# Patient Record
Sex: Male | Born: 1975
Health system: Southern US, Community
[De-identification: ages and names within clinical notes are randomized; demographics above are authoritative.]

## PROBLEM LIST (undated history)

## (undated) HISTORY — PX: UVULOPALATOPLASTY: SHX2633

## (undated) HISTORY — PX: TONSILLECTOMY: SUR1361

## (undated) HISTORY — PX: APPENDECTOMY: SHX54

---

## 2008-06-02 ENCOUNTER — Ambulatory Visit: Payer: Self-pay | Admitting: Occupational Medicine

## 2009-09-16 ENCOUNTER — Ambulatory Visit: Payer: Self-pay | Admitting: Family Medicine

## 2009-09-30 ENCOUNTER — Ambulatory Visit: Payer: Self-pay | Admitting: Family Medicine

## 2009-09-30 DIAGNOSIS — G2581 Restless legs syndrome: Secondary | ICD-10-CM

## 2009-09-30 DIAGNOSIS — G4700 Insomnia, unspecified: Secondary | ICD-10-CM | POA: Insufficient documentation

## 2009-10-01 LAB — CONVERTED CEMR LAB
ALT: 42 units/L (ref 0–53)
AST: 23 units/L (ref 0–37)
Alkaline Phosphatase: 81 units/L (ref 39–117)
BUN: 12 mg/dL (ref 6–23)
Calcium: 9.3 mg/dL (ref 8.4–10.5)
Chloride: 105 meq/L (ref 96–112)
Cholesterol: 172 mg/dL (ref 0–200)
HCT: 47.7 % (ref 39.0–52.0)
LDL Cholesterol: 113 mg/dL — ABNORMAL HIGH (ref 0–99)
MCHC: 34 g/dL (ref 30.0–36.0)
MCV: 91.9 fL (ref 78.0–100.0)
Platelets: 245 10*3/uL (ref 150–400)
RBC: 5.19 M/uL (ref 4.22–5.81)
RDW: 12.7 % (ref 11.5–15.5)
TSH: 2.193 microintl units/mL (ref 0.350–4.500)
Total CHOL/HDL Ratio: 3.8
Triglycerides: 70 mg/dL (ref <150)
VLDL: 14 mg/dL (ref 0–40)
WBC: 6.4 10*3/uL (ref 4.0–10.5)

## 2009-10-07 ENCOUNTER — Ambulatory Visit: Payer: Self-pay | Admitting: Family Medicine

## 2009-12-23 ENCOUNTER — Ambulatory Visit: Payer: Self-pay | Admitting: Family Medicine

## 2009-12-23 DIAGNOSIS — M25519 Pain in unspecified shoulder: Secondary | ICD-10-CM

## 2010-01-11 ENCOUNTER — Ambulatory Visit: Payer: Self-pay | Admitting: Family Medicine

## 2010-01-12 LAB — CONVERTED CEMR LAB
Iron: 94 ug/dL (ref 42–165)
UIBC: 191 ug/dL

## 2010-01-21 ENCOUNTER — Encounter: Payer: Self-pay | Admitting: Family Medicine

## 2010-01-21 ENCOUNTER — Encounter: Admission: RE | Admit: 2010-01-21 | Discharge: 2010-01-21 | Payer: Self-pay | Admitting: Sports Medicine

## 2010-01-25 ENCOUNTER — Encounter
Admission: RE | Admit: 2010-01-25 | Discharge: 2010-02-18 | Payer: Self-pay | Source: Home / Self Care | Attending: Sports Medicine | Admitting: Sports Medicine

## 2010-03-23 NOTE — Assessment & Plan Note (Signed)
Summary: Shoulder pain, RLS, insomnia   Vital Signs:  Patient profile:   35 year old male Height:      73 inches Weight:      223 pounds Pulse rate:   73 / minute BP sitting:   120 / 73  (right arm) Cuff size:   regular  Vitals Entered By: Avon Gully CMA, Duncan Dull) (December 23, 2009 11:25 AM) CC: left shoulder pain x 3-4 weeks, hurts to sleep on, feels like it lost strength, also med not working anymore   Primary Care Provider:  Nani Gasser MD  CC:  left shoulder pain x 3-4 weeks, hurts to sleep on, feels like it lost strength, and also med not working anymore.  History of Present Illness: left shoulder pain x 3-4 weeks, hurts to sleep on, feels like it lost strength. Felt weak initially to raise above his sholder level. Pain is over the deltoid and collar bone. Painful at rest if sits for long period like at church. painful to reach behind his back.  Using an OTC rub.  No ice or heat. Tried IBU a couple of time for more severe pain - helps some.  No injury or known triggers. Had a hx of bursitis in his shoulder but not sure which shoulder. It was more than 15 years ago.   Feels the  meds for his RLS is  not working as well. Tried taking 2 tabs because one pill wasn't helping.  2 tabs of requip did help. He also hasn't been sleeping as well so has taken 1.5 tabs of trazodone a few nights. that has helped.   Current Medications (verified): 1)  Requip 0.5 Mg Tabs (Ropinirole Hcl) .... Take 1-2  Tablet By Mouth Once A Day in The Evening 2)  Trazodone Hcl 100 Mg Tabs (Trazodone Hcl) .... Take 1 Tablet By Mouth Once A Day At Bedtime For Sleep 3)  Penlac 8 % Soln (Ciclopirox) .... Nail Lacquer.  Applyto Nail At Bedtime. After 7 Days Clean Off With Alcohol. Continue Until Fungus Resolves.  Allergies (verified): No Known Drug Allergies  Comments:  Nurse/Medical Assistant: The patient's medications and allergies were reviewed with the patient and were updated in the Medication  and Allergy Lists. Avon Gully CMA, Duncan Dull) (December 23, 2009 11:27 AM)  Physical Exam  General:  Well-developed,well-nourished,in no acute distress; alert,appropriate and cooperative throughout examination Msk:  Left shoulder with no swelling, NROM. Pain with reaching behind his back. Nontender. STrength 5/5 at the shoulder, elbow, wrist and thumb. Neg empty can test. Neck with NROM.     Impression & Recommendations:  Problem # 1:  SHOULDER PAIN (ICD-719.41) Assessment New Likely subacromial bursitis.  Rec stretches. H.O. given. Ice the area and NSAIDS. If not better in 2-3 weeks then followup.  It is already some better on its own. No lifting greater than 10 lbs and avoid activities that are bothersome.   Problem # 2:  RESTLESS LEGS SYNDROME (ICD-333.94) Assessment: Deteriorated  Increase to 1mg  dose.  Can half if needed.  Rule out deficienes that may be making his sxs worse.   Orders: T-Iron [id number]) T-Iron Binding Capacity (TIBC) ([address]) T-Vitamin B12 [id number]) T-Folate (66063)  Problem # 3:  INSOMNIA (ICD-780.52) Assessment: Deteriorated Continue current regime. If we can get her shoulder better then I really feel he will rest better.    Complete Medication List: 1)  Requip 1 Mg Tabs (Ropinirole hcl) .... Take 1 tablet by mouth once a day at bedtime 2)  Trazodone Hcl 100 Mg Tabs (Trazodone hcl) .... Take 1 tablet by mouth once a day at bedtime for sleep 3)  Penlac 8 % Soln (Ciclopirox) .... Nail lacquer.  applyto nail at bedtime. after 7 days clean off with alcohol. continue until fungus resolves.  Patient Instructions: 1)  Ibuprofen 600-800mg  three times a day with food and water for 5-6 days.   2)  Start the stretches on Saturday 3)  Ice the area three times a day if able.   Prescriptions: TRAZODONE HCL 100 MG TABS (TRAZODONE HCL) Take 1 tablet by mouth once a day at bedtime for sleep  #90 x 1   Entered and Authorized by:   Nani Gasser MD    Signed by:   Nani Gasser MD on 12/23/2009   Method used:   Electronically to        [facility] [phone]* (retail)       [address]       [place], [place]  [address]       Ph: [phone]       Fax: [phone]   RxID:   [id number] REQUIP 1 MG TABS (ROPINIROLE HCL) Take 1 tablet by mouth once a day at bedtime  #30 x 1   Entered and Authorized by:   Nani Gasser MD   Signed by:   Nani Gasser MD on 12/23/2009   Method used:   Electronically to        [facility] [phone]* (retail)       [address]       [place], [place]  [address]       Ph: [phone]       Fax: [phone]   RxID:   [id number]    Orders Added: 1)  Est. Patient Level IV [26948] 2)  Augusto Gamble [54627-03500] 3)  T-Iron Binding Capacity (TIBC) [[address]] 4)  T-Vitamin B12 [82607-23330] 5)  T-Folate [71696]

## 2010-03-23 NOTE — Letter (Signed)
Summary: [facility]  [facility]   Imported By: Maryln Gottron 01/29/2010 14:01:15  _____________________________________________________________________  External Attachment:    Type:   Image     Comment:   External Document

## 2010-03-23 NOTE — Assessment & Plan Note (Signed)
Summary: cough/chest congestion   Vital Signs:  Patient Profile:   35 Years Old Male CC:      cough and congestion X 5 days Height:     75 inches Weight:      220 pounds O2 Sat:      97 % O2 treatment:    Room Air Temp:     97.0 degrees F oral Pulse rate:   78 / minute Resp:     12 per minute BP sitting:   122 / 80  (right arm) Cuff size:   large  Vitals Entered By: Lajean Saver RN (September 16, 2009 12:42 PM)                  Updated Prior Medication List: DAYQUIL MULTI-SYMPTOM 30-325-10 MG/15ML LIQD (PSEUDOEPHEDRINE-APAP-DM) As directed  Current Allergies (reviewed today): No known allergies History of Present Illness Chief Complaint: cough and congestion X 5 days History of Present Illness:  Subjective: Patient complains of URI symptoms that started one week ago with a sore throat, now resolved.  He complains of persistent cough and nasal congestion.   No pleuritic pain No wheezing + post-nasal drainage No sinus pain/pressure No itchy/red eyes No earache No hemoptysis No SOB No fever/chills No nausea No vomiting No abdominal pain No diarrhea No skin rashes + fatigue No myalgias No headache Used OTC meds without relief   REVIEW OF SYSTEMS Constitutional Symptoms      Denies fever, chills, night sweats, weight loss, weight gain, and fatigue.  Eyes       Denies change in vision, eye pain, eye discharge, glasses, contact lenses, and eye surgery. Ear/Nose/Throat/Mouth       Complains of sinus problems and hoarseness.      Denies hearing loss/aids, change in hearing, ear pain, ear discharge, dizziness, frequent runny nose, frequent nose bleeds, sore throat, and tooth pain or bleeding.  Respiratory       Complains of productive cough and shortness of breath.      Denies dry cough, wheezing, asthma, bronchitis, and emphysema/COPD.  Cardiovascular       Denies murmurs, chest pain, and tires easily with exhertion.    Gastrointestinal       Denies stomach  pain, nausea/vomiting, diarrhea, constipation, blood in bowel movements, and indigestion. Genitourniary       Denies painful urination, kidney stones, and loss of urinary control. Neurological       Denies paralysis, seizures, and fainting/blackouts. Musculoskeletal       Denies muscle pain, joint pain, joint stiffness, decreased range of motion, redness, swelling, muscle weakness, and gout.  Skin       Denies bruising, unusual mles/lumps or sores, and hair/skin or nail changes.  Psych       Denies mood changes, temper/anger issues, anxiety/stress, speech problems, depression, and sleep problems.  Past History:  Past Medical History: Reviewed history from 06/02/2008 and no changes required. Unremarkable  Past Surgical History: Reviewed history from 06/02/2008 and no changes required. Appendectomy Sleep Apnea  Social History: Reviewed history from 06/02/2008 and no changes required. Married Never Smoked Alcohol use-no Drug use-no Regular exercise-no   Objective:  Appearance:  Patient appears healthy, stated age, and in no acute distress  Eyes:  Pupils are equal, round, and reactive to light and accomdation.  Extraocular movement is intact.  Conjunctivae are not inflamed.  Ears:  Canals normal.  Tympanic membranes normal.   Nose:  Normal septum.  Normal turbinates, mildly congested.    No sinus  tenderness present.  Pharynx:  Normal  Neck:  Supple.  No adenopathy is present.  No thyromegaly is present  Lungs:  Clear to auscultation.  Breath sounds are equal.  Heart:  Regular rate and rhythm without murmurs, rubs, or gallops.  Abdomen:  Nontender without masses or hepatosplenomegaly.  Bowel sounds are present.  No CVA or flank tenderness.  Skin:  No rash Assessment New Problems: URI (ICD-465.9)  VIRAL URI  Plan New Medications/Changes: AZITHROMYCIN 250 MG TABS (AZITHROMYCIN) Two tabs by mouth on day 1, then 1 tab daily on days 2 through 5  (Rx void after 09/23/09)  #6  tabs x 0, 09/16/2009, Donna Christen MD BENZONATATE 200 MG CAPS (BENZONATATE) One by mouth hs as needed cough  #12 x 0, 09/16/2009, Donna Christen MD  New Orders: Est. Patient Level III [phone] Planning Comments:   Treat symptomatically for now:  expectorant/decongestant, fluids, cough suppressant at bedtime. Add Z-pack (given Rx to hold) if not improving within one week or if persistent fever develops.   The patient and/or caregiver has been counseled thoroughly with regard to medications prescribed including dosage, schedule, interactions, rationale for use, and possible side effects and they verbalize understanding.  Diagnoses and expected course of recovery discussed and will return if not improved as expected or if the condition worsens. Patient and/or caregiver verbalized understanding.  Prescriptions: AZITHROMYCIN 250 MG TABS (AZITHROMYCIN) Two tabs by mouth on day 1, then 1 tab daily on days 2 through 5  (Rx void after 09/23/09)  #6 tabs x 0   Entered and Authorized by:   Donna Christen MD   Signed by:   Donna Christen MD on 09/16/2009   Method used:   Print then Give to Patient   RxID:   [id number] BENZONATATE 200 MG CAPS (BENZONATATE) One by mouth hs as needed cough  #12 x 0   Entered and Authorized by:   Donna Christen MD   Signed by:   Donna Christen MD on 09/16/2009   Method used:   Print then Give to Patient   RxID:   [id number]   Patient Instructions: 1)  May use Mucinex D (guaifenesin with decongestant) twice daily for congestion. 2)  Increase fluid intake, rest. 3)  May use Afrin nasal spray (or generic oxymetazoline) twice daily for about 5 days.  Also recommend using saline nasal spray several times daily and/or saline nasal irrigation. 4)  Add azithromycin if persistent fever develops, or if not improving in 5 to 7 days. 5)  Followup with family doctor if not improving 7 to 10 days.  Orders Added: 1)  Est. Patient Level III [29528]

## 2010-03-23 NOTE — Assessment & Plan Note (Signed)
Summary: NOV: RLS, Insomnia   Vital Signs:  Patient profile:   35 year old male Height:      73 inches Weight:      219 pounds BMI:     29.00 Pulse rate:   73 / minute BP sitting:   116 / 76  (left arm) Cuff size:   regular  Vitals Entered By: Avon Gully CMA, Duncan Dull) (September 30, 2009 8:56 AM) CC: NP--difficulty sleeping at night.hx of restless leg syndrome, did take Requip in the past,not currently taking anything   CC:  NP--difficulty sleeping at night.hx of restless leg syndrome, did take Requip in the past, and not currently taking anything.  History of Present Illness: NP--difficulty sleeping at night.  hx of restless leg syndrome, did take Requip 0.5mg  in the past,not currently taking anything. Says it really worked well but wasn't sure if he was supposed to continue it or not so completed hid prescription and didn't refill it.  His sxs have been worse lately.  IN addition, hasn't been sleeping well. Waking up 5-6 times at night. Does't feel very stressed. Having a hard time falling asleep.  Says this is really chronic.   When really exhausted does sleep better.  WAs on Ambien at one time and it did really help. Has also tried Tylenol PM but feels groggy the first 2-3 hours.   Habits & Providers  Alcohol-Tobacco-Diet     Alcohol drinks/day: <1     Tobacco Status: never  Exercise-Depression-Behavior     Does Patient Exercise: no     STD Risk: never     Drug Use: never     Seat Belt Use: always  Current Medications (verified): 1)  None  Allergies (verified): No Known Drug Allergies  Comments:  Nurse/Medical Assistant: The patient's medications and allergies were reviewed with the patient and were updated in the Medication and Allergy Lists. Avon Gully CMA, Duncan Dull) (September 30, 2009 8:59 AM)  Past History:  Past Surgical History: Appendectomy Sleep Apnea - [facility]  Family History: None  Social History: Works at the [employer] at [employer] Assoc  degree. Married to Amy with one child.  Never Smoked Alcohol use-no Drug use-no Regular exercise-no 4 caffeinated drinks per day. STD Risk:  never Drug Use:  never Seat Belt Use:  always  Review of Systems       No fever/sweats/weakness, unexplained weight loss/gain.  No vison changes.  No difficulty hearing/ringing in ears, hay fever/allergies.  No chest pain/discomfort, palpitations.  No Br lump/nipple discharge.  No cough/wheeze.  No blood in BM, nausea/vomiting/diarrhea.  No nighttime urination, leaking urine, unusual vaginal bleeding, discharge (penis or vagina).  No muscle/joint pain. No rash, change in mole.  No HA, memory loss.  No anxiety, + sleep d/o, no depression.  No easy bruising/bleeding, unexplained lump   Physical Exam  General:  Well-developed,well-nourished,in no acute distress; alert,appropriate and cooperative throughout examination Head:  Normocephalic and atraumatic without obvious abnormalities. No apparent alopecia or balding. Lungs:  Normal respiratory effort, chest expands symmetrically. Lungs are clear to auscultation, no crackles or wheezes. Heart:  Normal rate and regular rhythm. S1 and S2 normal without gallop, murmur, click, rub or other extra sounds. Skin:  no rashes.   Psych:  Cognition and judgment appear intact. Alert and cooperative with normal attention span and concentration. No apparent delusions, illusions, hallucinations   Impression & Recommendations:  Problem # 1:  RESTLESS LEGS SYNDROME (ICD-333.94) Will restart requip. Restart with low dose. Will rule out anemia  and thyroid d/o which can mimick RLS symptoms.  Orders: T-CBC No Diff ([id number]) T-TSH ([id number])  Problem # 2:  INSOMNIA (ICD-780.52) Discussed sleep hygiene. In addition discussed sleep aids. His insomnia is really chronic so wouldlike to start with trazodone before staring with [place] or lunesta which can cause dependence.  Discussed potential SE of these medications.  F/U in 1 month to see how well it is workon.   Complete Medication List: 1)  Requip 0.5 Mg Tabs (Ropinirole hcl) .... Take 1 tablet by mouth once a day 2)  Trazodone Hcl 50 Mg Tabs (Trazodone hcl) .... 1/2 to 1 tab about 1 hour before bedtime for insomnia  Other Orders: T-Comprehensive Metabolic Panel [id number]) T-Lipid Profile ([id number])  Patient Instructions: 1)  Can schedule your physical anytime. 2)  Also recommend follow up for insomnia in 1-2 months.  Prescriptions: TRAZODONE HCL 50 MG TABS (TRAZODONE HCL) 1/2 to 1 tab about 1 hour before bedtime for insomnia  #30 x 1   Entered and Authorized by:   Nani Gasser MD   Signed by:   Nani Gasser MD on 09/30/2009   Method used:   Electronically to        [facility] [phone]* (retail)       [address]       [place], [place]  [address]       Ph: [phone]       Fax: [phone]   RxID:   [id number] REQUIP 0.5 MG TABS (ROPINIROLE HCL) Take 1 tablet by mouth once a day  #30 x 2   Entered and Authorized by:   Nani Gasser MD   Signed by:   Nani Gasser MD on 09/30/2009   Method used:   Electronically to        [facility] [phone]* (retail)       [address]       [place], [place]  [address]       Ph: [phone]       Fax: [phone]   RxID:   [id number]

## 2010-03-23 NOTE — Assessment & Plan Note (Signed)
Summary: CPE   Vital Signs:  Patient profile:   35 year old male Height:      73 inches Weight:      222 pounds Pulse rate:   79 / minute BP sitting:   132 / 81  (left arm) Cuff size:   regular  Vitals Entered By: Avon Gully CMA, Duncan Dull) (October 07, 2009 11:07 AM) CC: CPE   Primary Care Ruben Mahler:  Nani Gasser MD  CC:  CPE.  History of Present Illness: Here for CPE.  No specific compaints. F/u of RLS and insomnia.    When was taking the requip and trazodone together was causing a HA. When separated them out the HA went away.  Meds were helping his RLS and sleep until last night. Last night didn't sleep at all until almost 3AM. No mind racing and anxiety.  Felt legs twitching and this  kept him awake. Trazodone has been helping some. Still waking up at night but not as much. Has worked on cutting back his caffeine after 2 o'clock.  Says Monday night when couldn't sleep did drink caffeine a bedtime.   Current Medications (verified): 1)  Requip 0.5 Mg Tabs (Ropinirole Hcl) .... Take 1 Tablet By Mouth Once A Day 2)  Trazodone Hcl 50 Mg Tabs (Trazodone Hcl) .... 1/2 To 1 Tab About 1 Hour Before Bedtime For Insomnia  Allergies (verified): No Known Drug Allergies  Comments:  Nurse/Medical Assistant: The patient's medications and allergies were reviewed with the patient and were updated in the Medication and Allergy Lists. Avon Gully CMA, Duncan Dull) (October 07, 2009 11:08 AM)  Past History:  Past Medical History: Last updated: 06/02/2008 Unremarkable  Past Surgical History: Last updated: 09/30/2009 Appendectomy Sleep Apnea - [facility]  Family History: Last updated: 09/30/2009 None  Social History: Last updated: 09/30/2009 Works at the [employer] at [employer]. Assoc degree. Married to Amy with one child.  Never Smoked Alcohol use-no Drug use-no Regular exercise-no 4 caffeinated drinks per day.   Review of Systems  The patient denies anorexia, fever,  weight loss, weight gain, vision loss, decreased hearing, hoarseness, chest pain, syncope, dyspnea on exertion, peripheral edema, prolonged cough, headaches, hemoptysis, abdominal pain, melena, hematochezia, severe indigestion/heartburn, hematuria, incontinence, genital sores, muscle weakness, suspicious skin lesions, transient blindness, difficulty walking, depression, unusual weight change, abnormal bleeding, enlarged lymph nodes, and testicular masses.    Physical Exam  General:  Well-developed,well-nourished,in no acute distress; alert,appropriate and cooperative throughout examination Head:  Normocephalic and atraumatic without obvious abnormalities. No apparent alopecia or balding. Eyes:  No corneal or conjunctival inflammation noted. EOMI. Perrla.  Ears:  External ear exam shows no significant lesions or deformities.  Otoscopic examination reveals clear canals, tympanic membranes are intact bilaterally without bulging, retraction, inflammation or discharge. Hearing is grossly normal bilaterally. Nose:  External nasal examination shows no deformity or inflammation.  Mouth:  Oral mucosa and oropharynx without lesions or exudates.  Teeth in good repair. Neck:  No deformities, masses, or tenderness noted. Chest Wall:  No deformities, masses, tenderness or gynecomastia noted. Lungs:  Normal respiratory effort, chest expands symmetrically. Lungs are clear to auscultation, no crackles or wheezes. Heart:  Normal rate and regular rhythm. S1 and S2 normal without gallop, murmur, click, rub or other extra sounds. Abdomen:  Bowel sounds positive,abdomen soft and non-tender without masses, organomegaly or hernias noted. Msk:  No deformity or scoliosis noted of thoracic or lumbar spine.   Pulses:  R and L carotid,radial,dorsalis pedis and posterior tibial pulses are full  and equal bilaterally Extremities:  No clubbing, cyanosis, edema, or deformity noted with normal full range of motion of all joints.     Neurologic:  No cranial nerve deficits noted. Station and gait are normal.  DTRs are symmetrical throughout. Sensory, motor and coordinative functions appear intact. Skin:  no rashes.   Cervical Nodes:  No lymphadenopathy noted Psych:  Cognition and judgment appear intact. Alert and cooperative with normal attention span and concentration. No apparent delusions, illusions, hallucinations   Impression & Recommendations:  Problem # 1:  HEALTH MAINTENANCE EXAM (ICD-V70.0) Here for CPE Reviewed his labs with him.Recheck cholesterol inj one year.  He reports his Td is up to date Encourage regular exercise and healthy die  Problem # 2:  INSOMNIA (ICD-780.52) Increase trazodone 100mg  nightly. F/U in 3 montsh or sooner if not working well.   Problem # 3:  RESTLESS LEGS SYNDROME (ICD-333.94) Dsicussed options of adding extra tab on night where his RLS is worse vs increasing hisnightly dose. He opted for first options. He will let me know if this works well and if so then will send  90 day rx for mail order.  f/u in 3 months.   Complete Medication List: 1)  Requip 0.5 Mg Tabs (Ropinirole hcl) .... Take 1-2  tablet by mouth once a day in the evening 2)  Trazodone Hcl 100 Mg Tabs (Trazodone hcl) .... Take 1 tablet by mouth once a day at bedtime for sleep 3)  Penlac 8 % Soln (Ciclopirox) .... Nail lacquer.  applyto nail at bedtime. after 7 days clean off with alcohol. continue until fungus resolves.  Patient Instructions: 1)  It is important that you exercise reguarly at least 20 minutes 5 times a week. If you develop chest pain, have severe difficulty breathing, or feel very tired, stop exercising immediately and seek medical attention.  2)  Can try the Penlac for your toenails.   Prescriptions: PENLAC 8 % SOLN (CICLOPIROX) Nail lacquer.  Applyto nail at bedtime. After 7 days clean off with alcohol. Continue until fungus resolves.  #1 bottle. x 2   Entered and Authorized by:   Nani Gasser  MD   Signed by:   Nani Gasser MD on 10/07/2009   Method used:   Electronically to        [facility] [phone]* (retail)       [address] [place], [place]  [address]       Ph: [phone]       Fax: [phone]   RxID:   [id number] TRAZODONE HCL 100 MG TABS (TRAZODONE HCL) Take 1 tablet by mouth once a day at bedtime for sleep  #30 x 1   Entered and Authorized by:   Nani Gasser MD   Signed by:   Nani Gasser MD on 10/07/2009   Method used:   Electronically to        [facility] [phone]* (retail)       [address]       [place], [place]  [address]       Ph: [phone]       Fax: [phone]   RxID:   [id number]   TD Result Date:  02/22/2008    Immunization History:  Tetanus/Td Immunization History:    Tetanus/Td:  historical (02/22/2008)

## 2010-03-23 NOTE — Assessment & Plan Note (Signed)
Summary: [name] should pain   Vital Signs:  Patient profile:   35 year old male Height:      73 inches Weight:      225 pounds Pulse rate:   85 / minute BP sitting:   119 / 78  (right arm) Cuff size:   regular  Vitals Entered By: Avon Gully CMA, Duncan Dull) (January 11, 2010 11:42 AM) CC: f/u left shoulder pain, no better   Primary Care Provider:  Nani Gasser MD  CC:  f/u left shoulder pain and no better.  History of Present Illness: f/u left shoulder pain, no better. Has stil been lifting things at his job. Still very painful to reach behind his shoulder.  Now has a burning sensation over the top of the shoulder towards teh deltoid. Worse and irritated with driving. IBU made his pain was.  Still taking it. ONly taking trazodone and requip at bedtime and causing nasal congestion.  Resting his arms on his chair at work or wrapped around his pillow helps.   Current Medications (verified): 1)  Requip 1 Mg Tabs (Ropinirole Hcl) .... Take 1 Tablet By Mouth Once A Day At Bedtime 2)  Trazodone Hcl 100 Mg Tabs (Trazodone Hcl) .... Take 1 Tablet By Mouth Once A Day At Bedtime For Sleep 3)  Penlac 8 % Soln (Ciclopirox) .... Nail Lacquer.  Applyto Nail At Bedtime. After 7 Days Clean Off With Alcohol. Continue Until Fungus Resolves. 4)  Ibuprofen 800 Mg Tabs (Ibuprofen)  Allergies (verified): No Known Drug Allergies  Comments:  Nurse/Medical Assistant: The patient's medications and allergies were reviewed with the patient and were updated in the Medication and Allergy Lists. Avon Gully CMA, Duncan Dull) (January 11, 2010 11:43 AM)  Physical Exam  General:  Well-developed,well-nourished,in no acute distress; alert,appropriate and cooperative throughout examination Msk:  Left shoulder wtih NROM. Some pain with reaching behind his back.  STerngth 5/5. Mild tender over the upper deltoid.    Impression & Recommendations:  Problem # 1:  SHOULDER PAIN (ICD-719.41) I still think he  likley has bursitis. Discussed option. Will refer to sports med for further evaluation. Try to rest his shoulder over the Holidays.  His updated medication list for this problem includes:    Ibuprofen 800 Mg Tabs (Ibuprofen)  Orders: Sports Medicine (Sports Med)  Complete Medication List: 1)  Requip 1 Mg Tabs (Ropinirole hcl) .... Take 1 tablet by mouth once a day at bedtime 2)  Trazodone Hcl 100 Mg Tabs (Trazodone hcl) .... Take 1 tablet by mouth once a day at bedtime for sleep 3)  Penlac 8 % Soln (Ciclopirox) .... Nail lacquer.  applyto nail at bedtime. after 7 days clean off with alcohol. continue until fungus resolves. 4)  Ibuprofen 800 Mg Tabs (Ibuprofen)  Patient Instructions: 1)  We will call you with the referral information.    Orders Added: 1)  Sports Medicine [Sports Med] 2)  Est. Patient Level III [57846]

## 2010-09-30 ENCOUNTER — Inpatient Hospital Stay (INDEPENDENT_AMBULATORY_CARE_PROVIDER_SITE_OTHER)
Admission: RE | Admit: 2010-09-30 | Discharge: 2010-09-30 | Disposition: A | Discharge status: Home / Self Care | Payer: BC Managed Care – PPO | Source: Ambulatory Visit | Attending: Family Medicine | Admitting: Family Medicine

## 2010-09-30 ENCOUNTER — Encounter: Payer: Self-pay | Admitting: Family Medicine

## 2010-09-30 ENCOUNTER — Telehealth: Payer: Self-pay | Admitting: *Deleted

## 2010-09-30 DIAGNOSIS — H05019 Cellulitis of unspecified orbit: Secondary | ICD-10-CM | POA: Insufficient documentation

## 2010-09-30 NOTE — Telephone Encounter (Signed)
Pt calling with what sounds like conjunctivitis, started with ST on Tuesday, then half way through yest started with itchy eyes, and today crusty eyes and discharge when he woke up.  Didn't feel well yesterday.  Do you want to see this problem in the office?? Routed to Dr. Linford Arnold SHT/LPN

## 2010-09-30 NOTE — Telephone Encounter (Signed)
He'll need to be seen. Dr. Cathey Endow has an acute visit for her scheduled for tomorrow.

## 2010-09-30 NOTE — Telephone Encounter (Signed)
Pt called back to find out the status of an appt or what to do for the pinkeye. Plan:  Pt instructed will need an office visit and since Dr. Cathey Endow out of the office this afternoon and pt does not want to wait til tomorrow he was instructed to go to [facility] this afternoon. Jarvis Newcomer, LPN Domingo Dimes

## 2010-10-02 ENCOUNTER — Telehealth (INDEPENDENT_AMBULATORY_CARE_PROVIDER_SITE_OTHER): Payer: Self-pay

## 2011-01-24 NOTE — Telephone Encounter (Signed)
  Phone Note Outgoing Call   Call placed by: Linton Flemings RN,  October 02, 2010 3:31 PM Call placed to: Patient Summary of Call: pt states he is doing better

## 2011-01-24 NOTE — Progress Notes (Signed)
Summary: Possible Pink eye (L) (room 5)   Vital Signs:  Patient Profile:   35 Years Old Male CC:      left eye discomfort/conjunctivitis/drainage Height:     75 inches Weight:      222 pounds O2 Sat:      96 % O2 treatment:    Room Air Temp:     98.3 degrees F oral Pulse rate:   85 / minute Resp:     16 per minute BP sitting:   125 / 89  (left arm) Cuff size:   large  Pt. in pain?   yes    Location:   left eye  Vitals Entered By: Lavell Islam RN (September 30, 2010 4:28 PM)                   Prior Medication List:  REQUIP 1 MG TABS (ROPINIROLE HCL) Take 1 tablet by mouth once a day at bedtime TRAZODONE HCL 100 MG TABS (TRAZODONE HCL) Take 1 tablet by mouth once a day at bedtime for sleep PENLAC 8 % SOLN (CICLOPIROX) Nail lacquer.  Applyto nail at bedtime. After 7 days clean off with alcohol. Continue until fungus resolves. IBUPROFEN 800 MG TABS (IBUPROFEN)    Updated Prior Medication List: IBUPROFEN 800 MG TABS (IBUPROFEN) as needed  Current Allergies: No known allergies History of Present Illness Chief Complaint: left eye discomfort/conjunctivitis/drainage History of Present Illness: Patient w/pain inthe l eye. He reports having a sore throat Tuesday night, hedache eye discomfort in the L eye on Wednesday and increase tearing, he reports as he put it eye boggers today, pain, and general malaise.   Current Problems: ORBITAL CELLULITIS (ICD-376.01) SHOULDER PAIN (ICD-719.41) HEALTH MAINTENANCE EXAM (ICD-V70.0) RESTLESS LEGS SYNDROME (ICD-333.94) INSOMNIA (ICD-780.52)   Current Meds IBUPROFEN 800 MG TABS (IBUPROFEN) as needed AUGMENTIN 875-125 MG TABS (AMOXICILLIN-POT CLAVULANATE) 1 by mouth 2 times daily ALLEGRA-D ALLERGY & CONGESTION 180-240 MG XR24H-TAB (FEXOFENADINE-PSEUDOEPHEDRINE) 1 by mouth q day  REVIEW OF SYSTEMS Constitutional Symptoms       Complains of fatigue.     Denies fever, chills, night sweats, weight loss, and weight gain.  Eyes   Complains of eye pain and eye drainage.      Denies change in vision, glasses, contact lenses, and eye surgery. Ear/Nose/Throat/Mouth       Complains of sore throat.      Denies hearing loss/aids, change in hearing, ear pain, ear discharge, dizziness, frequent runny nose, frequent nose bleeds, sinus problems, hoarseness, and tooth pain or bleeding.  Respiratory       Denies dry cough, productive cough, wheezing, shortness of breath, asthma, bronchitis, and emphysema/COPD.  Cardiovascular       Denies murmurs, chest pain, and tires easily with exhertion.    Gastrointestinal       Denies stomach pain, nausea/vomiting, diarrhea, constipation, blood in bowel movements, and indigestion. Genitourniary       Denies painful urination, kidney stones, and loss of urinary control. Neurological       Complains of headaches.      Denies paralysis, seizures, and fainting/blackouts. Musculoskeletal       Denies muscle pain, joint pain, joint stiffness, decreased range of motion, redness, swelling, muscle weakness, and gout.  Skin       Denies bruising, unusual mles/lumps or sores, and hair/skin or nail changes.  Psych       Denies mood changes, temper/anger issues, anxiety/stress, speech problems, depression, and sleep problems. Other Comments: left eye drainage/  itching/ conjunctivitis   Past History:  Family History: Last updated: 09/30/2009 None  Social History: Last updated: 09/30/2009 Works at the [employer] at [employer]. Assoc degree. Married to Amy with one child.  Never Smoked Alcohol use-no Drug use-no Regular exercise-no 4 caffeinated drinks per day.   Risk Factors: Alcohol Use: <1 (09/30/2009) Exercise: no (09/30/2009)  Risk Factors: Smoking Status: never (09/30/2009)  Past Medical History: Reviewed history from 06/02/2008 and no changes required. Unremarkable  Past Surgical History: Reviewed history from 09/30/2009 and no changes required. Appendectomy Sleep  Apnea - UVP  Family History: Reviewed history from 09/30/2009 and no changes required. None  Social History: Reviewed history from 09/30/2009 and no changes required. Works at the [employer] at [employer]. Assoc degree. Married to Amy with one child.  Never Smoked Alcohol use-no Drug use-no Regular exercise-no 4 caffeinated drinks per day.  Physical Exam General appearance: well developed, well nourished, no acute distress Head: normocephalic, atraumatic Eyes: allergic shiners around both eyes wL>R, increase L periorbital swelling as well n Pupils: equal, round, reactive to light Ears: normal, no lesions or deformities Nasal: mucosa pink, nonedematous, no septal deviation, turbinates normal Neck: neck supple,  trachea midline, no masses Skin: no obvious rashes or lesions MSE: oriented to time, place, and person Assessment Additional workup planned New Problems: ORBITAL CELLULITIS (ICD-376.01)   Patient Education: Patient and/or caregiver instructed in the following: rest fluids and Tylenol.  Plan New Medications/Changes: ALLEGRA-D ALLERGY & CONGESTION 180-240 MG XR24H-TAB (FEXOFENADINE-PSEUDOEPHEDRINE) 1 by mouth q day  #30 x 0, 09/30/2010, Hassan Rowan MD AUGMENTIN [id number] MG TABS (AMOXICILLIN-POT CLAVULANATE) 1 by mouth 2 times daily  #20 x 0, 09/30/2010, Hassan Rowan MD  New Orders: Rocephin  250mg  [X5284] Est. Patient Level III [13244] Follow Up: Follow up in 2-3 days if no improvement, Follow up on an as needed basis, Follow up with Primary Physician Work/School Excuse: Return to work/school in 2 days  The patient and/or caregiver has been counseled thoroughly with regard to medications prescribed including dosage, schedule, interactions, rationale for use, and possible side effects and they verbalize understanding.  Diagnoses and expected course of recovery discussed and will return if not improved as expected or if the condition worsens. Patient and/or caregiver  verbalized understanding.  Prescriptions: ALLEGRA-D ALLERGY & CONGESTION 180-240 MG XR24H-TAB (FEXOFENADINE-PSEUDOEPHEDRINE) 1 by mouth q day  #30 x 0   Entered and Authorized by:   Hassan Rowan MD   Signed by:   Hassan Rowan MD on 09/30/2010   Method used:   Printed then faxed to ...       [facility] [phone]* (retail)       [address] [place], [place]  [address]       Ph: [phone]       Fax: [phone]   RxID:   [id number] AUGMENTIN 875-125 MG TABS (AMOXICILLIN-POT CLAVULANATE) 1 by mouth 2 times daily  #20 x 0   Entered and Authorized by:   Hassan Rowan MD   Signed by:   Hassan Rowan MD on 09/30/2010   Method used:   Printed then faxed to ...       [facility] [phone]* (retail)       [address] [place], [place]  [address]       Ph: [phone]       Fax: [phone]   RxID:   [id number]   Patient Instructions:  1)  Please schedule a follow-up appointment as needed. 2)  Please schedule an appointment with your primary doctor in :3-5 days if notimproving 3)  Take your antibiotic as prescribed until ALL of it is gone, but stop if you develop a rash or swelling and contact our office as soon as possible. 4)  Recommended remaining out of work for tomorrow.  Medication Administration  Injection # 1:    Medication: Rocephin  250mg     Route: IM    Site: RUOQ gluteus    Exp Date: 04/21/2013    Lot #: [id number]    Mfr: Sandoz    Comments: 1gm given    Patient tolerated injection without complications    Given by: Lajean Saver RN (September 30, 2010 5:02 PM)  Orders Added: 1)  Rocephin  250mg  [J0696] 2)  Est. Patient Level III [04540]

## 2011-01-24 NOTE — Letter (Signed)
Summary: Out of Work  [facility]  [address]   [place], [place] [address]   Phone: [phone]  Fax: [phone]    September 30, 2010   Employee:  KYON BENTLER    To Whom It May Concern:   For Medical reasons, please excuse the above named employee from work for the following dates:  Start:   09/30/2010  Return:   10/02/2010  If you need additional information, please feel free to contact our office.         Sincerely,    Hassan Rowan MD

## 2011-04-22 ENCOUNTER — Ambulatory Visit (INDEPENDENT_AMBULATORY_CARE_PROVIDER_SITE_OTHER): Payer: BC Managed Care – PPO | Admitting: Family Medicine

## 2011-04-22 DIAGNOSIS — J329 Chronic sinusitis, unspecified: Secondary | ICD-10-CM

## 2011-04-22 MED ORDER — AMOXICILLIN-POT CLAVULANATE 875-125 MG PO TABS: 1.0000 | ORAL_TABLET | Freq: Two times a day (BID) | ORAL | Status: AC

## 2011-04-22 NOTE — Progress Notes (Signed)
  Subjective:    Patient ID: Terry King, male    DOB: [birth year], 36 y.o.   MRN: [id number]  HPI Had a URI 2 weeks ago and felt a little better and then 8 days ago ST again.  +runny nose and nasal congestion.  Was getting brown colored sputum  Teeth started hurting again last night.  Left side of face below and above the eye is painful when sniffs. Had fever 5-6 days ago for about 1 days.  Last decongestant was 2 days ago.  Feels he is getting worse over the last 2 days. Had cough initiallly but that ius actually some better.    Review of Systems     Objective:   Physical Exam  Constitutional: He is oriented to person, place, and time. He appears well-developed and well-nourished.  HENT:  Head: Normocephalic and atraumatic.  Right Ear: External ear normal.  Left Ear: External ear normal.  Nose: Nose normal.  Mouth/Throat: Oropharynx is clear and moist.       TMs and canals are clear. Very tender over the right maxillary sinus and some swelling there as well.   Eyes: Conjunctivae and EOM are normal. Pupils are equal, round, and reactive to light.  Neck: Neck supple. No thyromegaly present.  Cardiovascular: Normal rate and normal heart sounds.   Pulmonary/Chest: Effort normal and breath sounds normal.  Lymphadenopathy:    He has no cervical adenopathy.  Neurological: He is alert and oriented to person, place, and time.  Skin: Skin is warm and dry.  Psychiatric: He has a normal mood and affect.          Assessment & Plan:  Sinusitis - Symptomatic care.  Will tx with ABX. I think he had a URI an dnow has a secondary bacterial sinus infection. Call if not better in one week. Can certainly add a mucinex or decongestant product if would like.

## 2011-04-22 NOTE — Patient Instructions (Signed)

## 2011-08-26 ENCOUNTER — Telehealth: Payer: Self-pay | Admitting: *Deleted

## 2011-08-26 MED ORDER — SCOPOLAMINE 1 MG/3DAYS TD PT72: 1.0000 | MEDICATED_PATCH | TRANSDERMAL | Status: DC

## 2011-08-26 NOTE — Telephone Encounter (Signed)
Pt states he is taking a trip to [place] and would like to know if we can send an rx to pharmacy for a med for motion sickness. Please advise.

## 2011-08-26 NOTE — Telephone Encounter (Signed)
Pt informed

## 2011-08-26 NOTE — Telephone Encounter (Signed)
Sent rx for motion sickness patch. Hope he has an awesome time!!!!!

## 2012-01-16 ENCOUNTER — Encounter: Payer: Self-pay | Admitting: Family Medicine

## 2012-01-16 ENCOUNTER — Ambulatory Visit (INDEPENDENT_AMBULATORY_CARE_PROVIDER_SITE_OTHER): Payer: BC Managed Care – PPO

## 2012-01-16 ENCOUNTER — Ambulatory Visit (INDEPENDENT_AMBULATORY_CARE_PROVIDER_SITE_OTHER): Payer: BC Managed Care – PPO | Admitting: Family Medicine

## 2012-01-16 ENCOUNTER — Other Ambulatory Visit: Payer: Self-pay | Admitting: Family Medicine

## 2012-01-16 VITALS — BP 139/82 | HR 92 | Ht 75.0 in | Wt 230.0 lb

## 2012-01-16 DIAGNOSIS — M25561 Pain in right knee: Secondary | ICD-10-CM

## 2012-01-16 DIAGNOSIS — M25569 Pain in unspecified knee: Secondary | ICD-10-CM

## 2012-01-16 DIAGNOSIS — R52 Pain, unspecified: Secondary | ICD-10-CM

## 2012-01-16 DIAGNOSIS — B351 Tinea unguium: Secondary | ICD-10-CM

## 2012-01-16 MED ORDER — TERBINAFINE HCL 250 MG PO TABS: 250.0000 mg | ORAL_TABLET | Freq: Every day | ORAL | Status: DC

## 2012-01-16 NOTE — Progress Notes (Signed)
  Subjective:    Patient ID: Terry King, male    DOB: 20-Jan-1976, 36 y.o.   MRN: [id number]  HPI Bilateral knee pain.  Right is worse than left.  Sometimes knee will throb and keep him awake, though usually a dull ache. Says pain has been getting worse. Has had pain for more than a year but the last 2 mo has been much worse.  Says has been using motrin daily - does help.  Knees will look swollen.  Will wake up with stiff knees.  After about 15 min, after his shower, it feels more normal. Tore his LDL a few years ago and never had surgery and did PT x 4 months.  Did try different shoewear but didn't make a difference. Feels worse walking upstair.No other joints that are painful.    Right great toe with fungus in the nail.  Has had similar sxs in hte past and saw Derm and was given a pill that worked well.  Tried topicals and failed.  Sxs started again a few months ago when went to [place].    Review of Systems     Objective:   Physical Exam  Constitutional: He is oriented to person, place, and time. He appears well-developed and well-nourished.  HENT:  Head: Normocephalic and atraumatic.  Musculoskeletal:       Knees are nontender.  No swelling.  NROM. No sig crepitus.  Neg McMurrys.  No increased laxity.  Strength in the knees and ankles is 5 out of 5 bilaterally. Right great toe now with pinpoint onychomycosis.  Neurological: He is alert and oriented to person, place, and time.  Skin: Skin is warm and dry.  Psychiatric: He has a normal mood and affect.          Assessment & Plan:  Bilateral knee pain - with the morning stiffness (though less than 30 min) will check sed rate, RF, uric acid level, etc.  since he has had pain for a year at this point time I would also like to get x-rays. He does have a history of chronic patellar tendinitis when he was in the [place]. This was different. Suspect patellofemoral syndrome based on his description.  Given h.o. On exercises.  If not  some improvement (25-30%) in 3 weeks then recommend he see Dr. Benjamin Stain for further evaluation and treatment. We'll call him with the x-ray and lab results are available.  Right great toe onychomycosis - Will treat with oral lamisil. Explained that it will take time to resolve.  Call if any problems.

## 2012-01-16 NOTE — Patient Instructions (Signed)
Ringworm, Nail  A fungal infection of the nail (tinea unguium/onychomycosis) is common. It is common as the visible part of the nail is composed of dead cells which have no blood supply to help prevent infection. It occurs because fungi are everywhere and will pick any opportunity to grow on any dead material.  Because nails are very slow growing they require up to 2 years of treatment with anti-fungal medications. The entire nail back to the base is infected. This includes approximately  of the nail which you cannot see.  If your caregiver has prescribed a medication by mouth, take it every day and as directed. No progress will be seen for at least 6 to 9 months. Do not be disappointed! Because fungi live on dead cells with little or no exposure to blood supply, medication delivery to the infection is slow; thus the cure is slow. It is also why you can observe no progress in the first 6 months. The nail becoming cured is the base of the nail, as it has the blood supply. Topical medication such as creams and ointments are usually not effective. Important in successful treatment of nail fungus is closely following the medication regimen that your doctor prescribes.  Sometimes you and your caregiver may elect to speed up this process by surgical removal of all the nails. Even this may still require 6 to 9 months of additional oral medications.  See your caregiver as directed. Remember there will be no visible improvement for at least 6 months. See your caregiver sooner if other signs of infection (redness and swelling) develop.  Document Released: 02/05/2000 Document Revised: 05/02/2011 Document Reviewed: 04/15/2008  Exit[employer] Patient Information 2013 [employer].

## 2012-01-17 LAB — CBC
MCHC: 35.2 g/dL (ref 30.0–36.0)
MCV: 91.6 fL (ref 78.0–100.0)
Platelets: 251 10*3/uL (ref 150–400)
RDW: 13.5 % (ref 11.5–15.5)
WBC: 9.7 10*3/uL (ref 4.0–10.5)

## 2012-01-17 LAB — SEDIMENTATION RATE: Sed Rate: 1 mm/hr (ref 0–16)

## 2012-01-17 LAB — URIC ACID: Uric Acid, Serum: 6.2 mg/dL (ref 4.0–7.8)

## 2012-04-22 ENCOUNTER — Other Ambulatory Visit: Payer: Self-pay | Admitting: Family Medicine

## 2012-05-08 ENCOUNTER — Encounter: Payer: Self-pay | Admitting: Family Medicine

## 2012-05-08 ENCOUNTER — Ambulatory Visit (INDEPENDENT_AMBULATORY_CARE_PROVIDER_SITE_OTHER): Payer: BC Managed Care – PPO | Admitting: Family Medicine

## 2012-05-08 VITALS — BP 115/65 | HR 93 | Ht 75.0 in | Wt 220.0 lb

## 2012-05-08 DIAGNOSIS — R41840 Attention and concentration deficit: Secondary | ICD-10-CM

## 2012-05-08 DIAGNOSIS — B351 Tinea unguium: Secondary | ICD-10-CM

## 2012-05-08 MED ORDER — CICLOPIROX 8 % EX SOLN: Freq: Every day | CUTANEOUS | Status: DC

## 2012-05-08 NOTE — Progress Notes (Signed)
Subjective:    Patient ID: Terry King, male    DOB: Nov 14, 1975, 37 y.o.   MRN: [id number]  HPI Completed 3 mo of Lamisil 2-3 weeks ago.right grea ttoenail. Toenail does looke better overall, they are not as thick and not as yellow.  Still has white spots   Thinks may have ADHD.  Says getting more difficulty to concentrate at work. Says feels like work is starting to suffer.  Says also in school at night. After 30 min in class says will zone out.  Sleeping better than was.  Has cut back on soda.  Says if lays the wrong way will get a HA.  Says if takes IBU before bedtime sleeps better. Has felt more down on himself if messes up at work,but denies feeling depressed. Says doesn't have as much fun as used to. Not withdrawing socially.  Says never has been good at studying much when was younger, but has really tried to work harder at this. Has to re-read passages.  Stress and volume or work has increased. Seems to procrastinate and then speeds through it. No family history.  No problems with heart disease or CP.    Review of Systems  BP 115/65  Pulse 93  Ht 6\' 3"  (1.905 m)  Wt 220 lb (99.791 kg)  BMI 27.5 kg/m2    No Known Allergies  No past medical history on file.  No past surgical history on file.  History   Social History  . Marital Status: Married    Spouse Name: N/A    Number of Children: N/A  . Years of Education: N/A   Occupational History  . Not on file.   Social History Main Topics  . Smoking status: Never Smoker   . Smokeless tobacco: Not on file  . Alcohol Use: Not on file  . Drug Use: Not on file  . Sexually Active: Not on file   Other Topics Concern  . Not on file   Social History Narrative  . No narrative on file    No family history on file.  Outpatient Encounter Prescriptions as of 05/08/2012  Medication Sig Dispense Refill  . ciclopirox (PENLAC) 8 % solution Apply topically at bedtime. Apply over nail and surrounding skin. Apply daily over  previous coat. After seven (7) days, may remove with alcohol and continue cycle.  6.6 mL  2  . ibuprofen (ADVIL,MOTRIN) 600 MG tablet Take 600 mg by mouth every 6 (six) hours as needed.      . [DISCONTINUED] scopolamine (TRANSDERM-SCOP) 1.5 MG Place 1 patch (1.5 mg total) onto the skin every 3 (three) days.  4 patch  0  . [DISCONTINUED] terbinafine (LAMISIL) 250 MG tablet Take 1 tablet (250 mg total) by mouth daily.  30 tablet  2   No facility-administered encounter medications on file as of 05/08/2012.          Objective:   Physical Exam  Constitutional: He appears well-developed and well-nourished.  HENT:  Head: Normocephalic and atraumatic.  Skin: Skin is warm and dry.  Has some superficial white spots on scattered over the toenail.   Psychiatric: He has a normal mood and affect. His behavior is normal.          .   Onychomycosis - at this point the fundus is certainly superficial now. I think he be a great candidate for Penlac solution. Apply nightly and then once we cleaned off with alcohol and continue to process until the lesions have resolved.  Inattention-I suspect he has ADD based on his symptoms. He did score 28 on the Adult AHDH-RS-IV with Adult Propts.  This is a + screen. Discussed at this point I would like her for him for further evaluation for possible ADD. I really don't think he has any predominant hyperactivity symptoms. He does sound like she's been maybe a little bit more down than usual but I don't know that he fully meets clinical depression but certainly think this is a consideration. Sleep quality is not necessarily good but is fair. Certainly this could be contributing. He is also working full-time and is going to school which can be very stressful as well as overwhelming and distracting. Thus I think a further evaluation is warranted before discussing and considering possible treatment options. Has no prior history of cardiac disease I do think he would be a  good candidate for a stimulant if the diagnosis is consistent with ADD.  Time spent 30 min, >50% spent in couseling about ADD

## 2012-05-08 NOTE — Patient Instructions (Addendum)
We will call and get your scheduled for for evaluation for possible ADD.

## 2012-05-11 ENCOUNTER — Encounter: Payer: Self-pay | Admitting: Family Medicine

## 2012-05-11 ENCOUNTER — Ambulatory Visit (INDEPENDENT_AMBULATORY_CARE_PROVIDER_SITE_OTHER): Payer: BC Managed Care – PPO | Admitting: Family Medicine

## 2012-05-11 VITALS — BP 121/71 | HR 83 | Ht 75.0 in | Wt 218.0 lb

## 2012-05-11 DIAGNOSIS — Z0000 Encounter for general adult medical examination without abnormal findings: Secondary | ICD-10-CM

## 2012-05-11 NOTE — Progress Notes (Signed)
  Subjective:    Patient ID: Terry King, male    DOB: January 11, 1976, 37 y.o.   MRN: [id number]  HPI Here for CPE. Has been more stressed with work. Working 50 hours per week.  Sleeping well.     Review of Systems comprehensvie ROS is negative.  BP 121/71  Pulse 83  Ht 6\' 3"  (1.905 m)  Wt 218 lb (98.884 kg)  BMI 27.25 kg/m2    No Known Allergies  History reviewed. No pertinent past medical history.  Past Surgical History  Procedure Laterality Date  . Appendectomy    . Palate surgery    . Tonsillectomy      History   Social History  . Marital Status: Married    Spouse Name: Amy    Number of Children: N/A  . Years of Education: N/A   Occupational History  . IT    Social History Main Topics  . Smoking status: Never Smoker   . Smokeless tobacco: Not on file  . Alcohol Use: Yes     Comment: ocassionally.   . Drug Use: No  . Sexually Active: Yes -- Male partner(s)   Other Topics Concern  . Not on file   Social History Narrative   No regular exercise.  Does track his steps.     Family History  Problem Relation Age of Onset  . Food Allergy Mother     smoker  . Diabetes Maternal Aunt   . Brain cancer Maternal Grandmother     Outpatient Encounter Prescriptions as of 05/11/2012  Medication Sig Dispense Refill  . ciclopirox (PENLAC) 8 % solution Apply topically at bedtime. Apply over nail and surrounding skin. Apply daily over previous coat. After seven (7) days, may remove with alcohol and continue cycle.  6.6 mL  2  . ibuprofen (ADVIL,MOTRIN) 600 MG tablet Take 600 mg by mouth every 6 (six) hours as needed.       No facility-administered encounter medications on file as of 05/11/2012.          Objective:   Physical Exam  Constitutional: He is oriented to person, place, and time. He appears well-developed and well-nourished.  HENT:  Head: Normocephalic and atraumatic.  Right Ear: External ear normal.  Left Ear: External ear normal.  Nose:  Nose normal.  Mouth/Throat: Oropharynx is clear and moist.  Eyes: Conjunctivae and EOM are normal. Pupils are equal, round, and reactive to light.  Neck: Normal range of motion. Neck supple. No thyromegaly present.  Cardiovascular: Normal rate, regular rhythm, normal heart sounds and intact distal pulses.   Pulmonary/Chest: Effort normal and breath sounds normal.  Abdominal: Soft. Bowel sounds are normal. He exhibits no distension and no mass. There is no tenderness. There is no rebound and no guarding.  Musculoskeletal: Normal range of motion.  Lymphadenopathy:    He has no cervical adenopathy.  Neurological: He is alert and oriented to person, place, and time. He has normal reflexes.  Skin: Skin is warm and dry.  Psychiatric: He has a normal mood and affect. His behavior is normal. Judgment and thought content normal.          Assessment & Plan:  CPE-  Keep up a regular exercise program and make sure you are eating a healthy diet Try to eat 4 servings of dairy a day, or if you are lactose intolerant take a calcium with vitamin D daily.  Your vaccines are up to date.  Due for screening labs.

## 2012-05-11 NOTE — Patient Instructions (Addendum)
Keep up a regular exercise program and make sure you are eating a healthy diet Try to eat 4 servings of dairy a day, or if you are lactose intolerant take a calcium with vitamin D daily.  Your vaccines are up to date.   

## 2012-05-15 LAB — COMPLETE METABOLIC PANEL WITH GFR
Albumin: 4.5 g/dL (ref 3.5–5.2)
Alkaline Phosphatase: 73 U/L (ref 39–117)
CO2: 29 mEq/L (ref 19–32)
GFR, Est African American: >89 mL/min
Potassium: 4.2 mEq/L (ref 3.5–5.3)
Sodium: 140 mEq/L (ref 135–145)
Total Bilirubin: 0.7 mg/dL (ref 0.3–1.2)
Total Protein: 6.5 g/dL (ref 6.0–8.3)

## 2012-05-15 LAB — LIPID PANEL
HDL: 40 mg/dL (ref >39)
LDL Cholesterol: 124 mg/dL — ABNORMAL HIGH (ref 0–99)
Total CHOL/HDL Ratio: 4.5 Ratio
Triglycerides: 77 mg/dL (ref <150)

## 2012-05-20 ENCOUNTER — Other Ambulatory Visit: Payer: Self-pay | Admitting: Family Medicine

## 2012-06-05 ENCOUNTER — Ambulatory Visit: Payer: BC Managed Care – PPO | Admitting: Psychology

## 2012-06-18 ENCOUNTER — Ambulatory Visit (INDEPENDENT_AMBULATORY_CARE_PROVIDER_SITE_OTHER): Payer: BC Managed Care – PPO | Admitting: Psychology

## 2012-06-18 DIAGNOSIS — F909 Attention-deficit hyperactivity disorder, unspecified type: Secondary | ICD-10-CM

## 2012-12-27 ENCOUNTER — Other Ambulatory Visit: Payer: Self-pay

## 2013-06-11 ENCOUNTER — Telehealth: Payer: Self-pay | Admitting: *Deleted

## 2013-06-11 ENCOUNTER — Encounter: Payer: BC Managed Care – PPO | Admitting: Family Medicine

## 2013-06-11 DIAGNOSIS — Z0000 Encounter for general adult medical examination without abnormal findings: Secondary | ICD-10-CM

## 2013-06-12 LAB — LIPID PANEL
CHOLESTEROL: 154 mg/dL (ref 0–200)
HDL: 39 mg/dL — AB (ref >39)
LDL CALC: 104 mg/dL — AB (ref 0–99)
Total CHOL/HDL Ratio: 3.9 Ratio
Triglycerides: 56 mg/dL (ref <150)
VLDL: 11 mg/dL (ref 0–40)

## 2013-06-12 LAB — COMPLETE METABOLIC PANEL WITH GFR
ALBUMIN: 4.1 g/dL (ref 3.5–5.2)
ALT: 22 U/L (ref 0–53)
AST: 18 U/L (ref 0–37)
Alkaline Phosphatase: 66 U/L (ref 39–117)
BILIRUBIN TOTAL: 0.8 mg/dL (ref 0.2–1.2)
BUN: 15 mg/dL (ref 6–23)
CALCIUM: 8.7 mg/dL (ref 8.4–10.5)
CHLORIDE: 106 meq/L (ref 96–112)
CO2: 26 mEq/L (ref 19–32)
Creat: 0.81 mg/dL (ref 0.50–1.35)
GFR, Est African American: >89 mL/min
GFR, Est Non African American: >89 mL/min
Glucose, Bld: 73 mg/dL (ref 70–99)
Potassium: 4 mEq/L (ref 3.5–5.3)
SODIUM: 142 meq/L (ref 135–145)
Total Protein: 6.4 g/dL (ref 6.0–8.3)

## 2013-06-12 NOTE — Telephone Encounter (Signed)
Lab orders.Deno Etienneonya L Genea Rheaume

## 2013-06-14 ENCOUNTER — Encounter: Payer: Self-pay | Admitting: Family Medicine

## 2013-06-14 ENCOUNTER — Ambulatory Visit (INDEPENDENT_AMBULATORY_CARE_PROVIDER_SITE_OTHER): Payer: BC Managed Care – PPO | Admitting: Family Medicine

## 2013-06-14 VITALS — BP 117/79 | HR 80 | Ht 75.0 in | Wt 218.0 lb

## 2013-06-14 DIAGNOSIS — Z0000 Encounter for general adult medical examination without abnormal findings: Secondary | ICD-10-CM

## 2013-06-14 NOTE — Progress Notes (Signed)
   Subjective:    Patient ID: Terry King, male    DOB: 23-May-1975, 38 y.o.   MRN: [id number]  HPI Here  For CPE today.  Had labs done last week.   No specific concerns today. He overall is doing well and feels well.   Review of Systems Comprehensive ROS is neg.   BP 117/79  Pulse 80  Ht 6\' 3"  (1.905 m)  Wt 218 lb (98.884 kg)  BMI 27.25 kg/m2  SpO2 97%    No Known Allergies  No past medical history on file.  Past Surgical History  Procedure Laterality Date  . Appendectomy    . Uvulopalatoplasty      For sleep apnea.    . Tonsillectomy      History   Social History  . Marital Status: Married    Spouse Name: Amy    Number of Children: N/A  . Years of Education: N/A   Occupational History  . IT    Social History Main Topics  . Smoking status: Never Smoker   . Smokeless tobacco: Not on file  . Alcohol Use: Yes     Comment: ocassionally.   . Drug Use: No  . Sexual Activity: Yes    Partners: Female   Other Topics Concern  . Not on file   Social History Narrative   No regular exercise.  Does track his steps.     Family History  Problem Relation Age of Onset  . Food Allergy Mother     smoker  . Diabetes Maternal Aunt   . Brain cancer Maternal Grandmother     Outpatient Encounter Prescriptions as of 06/14/2013  Medication Sig  . ibuprofen (ADVIL,MOTRIN) 600 MG tablet Take 600 mg by mouth every 6 (six) hours as needed.  . [DISCONTINUED] ciclopirox (PENLAC) 8 % solution Apply topically at bedtime. Apply over nail and surrounding skin. Apply daily over previous coat. After seven (7) days, may remove with alcohol and continue cycle.          Objective:   Physical Exam  Constitutional: He is oriented to person, place, and time. He appears well-developed and well-nourished.  HENT:  Head: Normocephalic and atraumatic.  Cardiovascular: Normal rate, regular rhythm and normal heart sounds.   Pulmonary/Chest: Effort normal and breath sounds  normal.  Neurological: He is alert and oriented to person, place, and time.  Skin: Skin is warm and dry.  Psychiatric: He has a normal mood and affect. His behavior is normal.          Assessment & Plan:  CPE Keep up a regular exercise program and make sure you are eating a healthy diet Try to eat 4 servings of dairy a day, or if you are lactose intolerant take a calcium with vitamin D daily.  Your vaccines are up to date.

## 2013-12-16 ENCOUNTER — Encounter: Payer: Self-pay | Admitting: Family Medicine

## 2013-12-16 ENCOUNTER — Ambulatory Visit (INDEPENDENT_AMBULATORY_CARE_PROVIDER_SITE_OTHER): Payer: 59 | Admitting: Family Medicine

## 2013-12-16 VITALS — BP 123/80 | HR 88 | Ht 75.0 in | Wt 222.0 lb

## 2013-12-16 DIAGNOSIS — M79672 Pain in left foot: Secondary | ICD-10-CM

## 2013-12-16 DIAGNOSIS — Z0189 Encounter for other specified special examinations: Secondary | ICD-10-CM

## 2013-12-16 DIAGNOSIS — Z0000 Encounter for general adult medical examination without abnormal findings: Secondary | ICD-10-CM

## 2013-12-16 LAB — COMPLETE METABOLIC PANEL WITH GFR
ALBUMIN: 4.5 g/dL (ref 3.5–5.2)
ALT: 51 U/L (ref 0–53)
AST: 25 U/L (ref 0–37)
Alkaline Phosphatase: 66 U/L (ref 39–117)
BUN: 13 mg/dL (ref 6–23)
CALCIUM: 9.2 mg/dL (ref 8.4–10.5)
CO2: 26 mEq/L (ref 19–32)
Chloride: 106 mEq/L (ref 96–112)
Creat: 1 mg/dL (ref 0.50–1.35)
Glucose, Bld: 86 mg/dL (ref 70–99)
Potassium: 4.3 mEq/L (ref 3.5–5.3)
Sodium: 141 mEq/L (ref 135–145)
TOTAL PROTEIN: 6.9 g/dL (ref 6.0–8.3)
Total Bilirubin: 1 mg/dL (ref 0.2–1.2)

## 2013-12-16 LAB — LIPID PANEL
CHOL/HDL RATIO: 3.7 ratio
CHOLESTEROL: 179 mg/dL (ref 0–200)
HDL: 49 mg/dL (ref >39)
LDL CALC: 118 mg/dL — AB (ref 0–99)
TRIGLYCERIDES: 60 mg/dL (ref <150)
VLDL: 12 mg/dL (ref 0–40)

## 2013-12-16 NOTE — Patient Instructions (Signed)
Keep up a regular exercise program and make sure you are eating a healthy diet Try to eat 4 servings of dairy a day, or if you are lactose intolerant take a calcium with vitamin D daily.  Your vaccines are up to date.   

## 2013-12-16 NOTE — Progress Notes (Signed)
Subjective:    Patient ID: Terry King, male    DOB: 1975/03/11, 38 y.o.   MRN: [id number]  HPI Here for CPE  today. He does have a concern about his fit today. Getting flu shot at work. Tdap is UTD.  CMP and lipids are UTD.  He would like to have the blood work repeated for his work.  Injured top of left foot about 2 months ago. He was playing with cyst on. Then, back in the car and drove home for almost 3 hours. Later that day his foot was so sore he could barely stand on it. It was very tender for the next couple of days and then he was able to walk on it normally. Now he just gets an occasional sharp shooting pain that's uncomfortable. And has difficulty with more close fitted shoes as it starts to feel sore and tender.  Feels like it pops sometimes.   Review of Systems comprehensive review of systems is negative today.  BP 123/80  Pulse 88  Ht 6\' 3"  (1.905 m)  Wt 222 lb (100.699 kg)  BMI 27.75 kg/m2    No Known Allergies  No past medical history on file.  Past Surgical History  Procedure Laterality Date  . Appendectomy    . Uvulopalatoplasty      For sleep apnea.    . Tonsillectomy      History   Social History  . Marital Status: Married    Spouse Name: Amy    Number of Children: N/A  . Years of Education: N/A   Occupational History  . IT    Social History Main Topics  . Smoking status: Never Smoker   . Smokeless tobacco: Not on file  . Alcohol Use: Yes     Comment: ocassionally.   . Drug Use: No  . Sexual Activity: Yes    Partners: Female   Other Topics Concern  . Not on file   Social History Narrative   No regular exercise.  Does track his steps.     Family History  Problem Relation Age of Onset  . Food Allergy Mother     smoker  . Diabetes Maternal Aunt   . Brain cancer Maternal Grandmother     Outpatient Encounter Prescriptions as of 12/16/2013  Medication Sig  . ibuprofen (ADVIL,MOTRIN) 600 MG tablet Take 600 mg by mouth every  6 (six) hours as needed.          Objective:   Physical Exam  Constitutional: He is oriented to person, place, and time. He appears well-developed and well-nourished.  HENT:  Head: Normocephalic and atraumatic.  Right Ear: External ear normal.  Left Ear: External ear normal.  Nose: Nose normal.  Mouth/Throat: Oropharynx is clear and moist.  Eyes: Conjunctivae and EOM are normal. Pupils are equal, round, and reactive to light.  Neck: Normal range of motion. Neck supple. No thyromegaly present.  Cardiovascular: Normal rate, regular rhythm, normal heart sounds and intact distal pulses.   Pulmonary/Chest: Effort normal and breath sounds normal.  Abdominal: Soft. Bowel sounds are normal. He exhibits no distension and no mass. There is no tenderness. There is no rebound and no guarding.  Musculoskeletal: Normal range of motion.  Lymphadenopathy:    He has no cervical adenopathy.  Neurological: He is alert and oriented to person, place, and time. He has normal reflexes.  Skin: Skin is warm and dry.  Psychiatric: He has a normal mood and affect. His behavior is normal. Judgment  and thought content normal.          Assessment & Plan:  CPE-  Keep up a regular exercise program and make sure you are eating a healthy diet Try to eat 4 servings of dairy a day, or if you are lactose intolerant take a calcium with vitamin D daily.  Your vaccines are up to date.   Right foot pain - nontender on exam. Wil get xray since has had pain over the bone x 2 months. Mya have had a stress fracture.

## 2013-12-23 ENCOUNTER — Telehealth: Payer: Self-pay | Admitting: *Deleted

## 2013-12-23 DIAGNOSIS — Z3009 Encounter for other general counseling and advice on contraception: Secondary | ICD-10-CM

## 2013-12-23 NOTE — Telephone Encounter (Signed)
Pt called and would like to have a referral placed for a vasectomy. He stated that he would like to go somewhere in [place] and on a Friday to have time to recover. Fwd to Dr. Linford ArnoldMetheney for f/u.Loralee PacasBarkley, Gretchen Weinfeld [place]

## 2013-12-23 NOTE — Telephone Encounter (Signed)
Referral placed. He will have to have a consult with the Doc and then they can schedule date of the procedure.

## 2013-12-24 NOTE — Telephone Encounter (Signed)
Pt informed.Terry King Lynetta  

## 2014-01-14 ENCOUNTER — Ambulatory Visit (INDEPENDENT_AMBULATORY_CARE_PROVIDER_SITE_OTHER): Payer: 59 | Admitting: Family Medicine

## 2014-01-14 ENCOUNTER — Encounter: Payer: Self-pay | Admitting: Family Medicine

## 2014-01-14 VITALS — BP 124/82 | HR 86 | Temp 98.0°F | Wt 223.0 lb

## 2014-01-14 DIAGNOSIS — M542 Cervicalgia: Secondary | ICD-10-CM

## 2014-01-14 MED ORDER — CLINDAMYCIN HCL 300 MG PO CAPS: 300.0000 mg | ORAL_CAPSULE | Freq: Three times a day (TID) | ORAL | Status: DC

## 2014-01-14 NOTE — Progress Notes (Signed)
   Subjective:    Patient ID: Terry King, male    DOB: 03-13-1975, 38 y.o.   MRN: [id number]  HPI Had a URI about 2.5 weeks ago.  [place] got back Sunday and then woke up this AM and the left side of his throat feet sore.  Says feels different than a regular ST like Strep throat  Feels like someone punched him.  Had some nasal congestion and blood in his nasal congestion.  No fever, chills, sweats. No GI sxs.   Feels like something is pushing against his neck when he swallow. Has some pain above his left eye over on the forehead.  Teeth on the left side were really hurting yesterday and took an IBU which helped some.    Review of Systems     Objective:   Physical Exam  Constitutional: He is oriented to person, place, and time. He appears well-developed and well-nourished.  HENT:  Head: Normocephalic and atraumatic.  Right Ear: External ear normal.  Left Ear: External ear normal.  Nose: Nose normal.  Mouth/Throat: Oropharynx is clear and moist.  TMs and canals are clear. Very tender over the left anterior side of neck. Feels maybe a little more full. No distinct swollen LN.    Eyes: Conjunctivae and EOM are normal. Pupils are equal, round, and reactive to light.  Neck: Neck supple. No thyromegaly present.  Cardiovascular: Normal rate and normal heart sounds.   Pulmonary/Chest: Effort normal and breath sounds normal.  Lymphadenopathy:    He has no cervical adenopathy.  Neurological: He is alert and oriented to person, place, and time.  Skin: Skin is warm and dry.  Psychiatric: He has a normal mood and affect.          Assessment & Plan:  Left sided neck pain - with recent cold and new onset pain on the left side of his face and neck will go ahead and put him on an antibiotic. We'll start with Augmentin. If he gets worse, doesn't improve, or develops a fever then please call the office back immediately. I put him on clindamycin 3 mg 3 times a  day. If he does not tolerate the GI side effects then please give me a call.

## 2014-08-01 LAB — IRON: Iron: 133

## 2014-08-01 LAB — BASIC METABOLIC PANEL
BUN: 11 mg/dL (ref 4–21)
Creatinine: 1 mg/dL (ref 0.6–1.3)
Glucose: 64 mg/dL
Potassium: 4 mmol/L (ref 3.4–5.3)
Sodium: 144 mmol/L (ref 137–147)

## 2014-08-01 LAB — LIPID PANEL
Cholesterol: 188 mg/dL (ref 0–200)
HDL: 47 mg/dL (ref 35–70)
LDL CALC: 126 mg/dL
Triglycerides: 75 mg/dL (ref 40–160)

## 2014-08-01 LAB — CBC AND DIFFERENTIAL
Hemoglobin: 16.2 g/dL (ref 13.5–17.5)
Platelets: 230 10*3/uL (ref 150–399)
WBC: 6.5 10^3/mL

## 2014-08-01 LAB — HEPATIC FUNCTION PANEL
ALT: 36 U/L (ref 10–40)
AST: 24 U/L (ref 14–40)
Alkaline Phosphatase: 74 U/L (ref 25–125)

## 2014-08-01 LAB — CHLORIDE: Chloride: 101 mmol/L

## 2014-08-01 LAB — PHOSPHORUS: Phosphorus: 2.5

## 2014-08-01 LAB — ESTIMATED GFR: GFR, Est Non African American: 98

## 2014-08-01 LAB — LACTATE DEHYDROGENASE: LDH: 229

## 2014-08-01 LAB — GAMMA GT: GGT: 23

## 2014-08-01 LAB — CALCIUM: Calcium: 9.3 mg/dL

## 2014-08-01 LAB — BILIRUBIN, TOTAL: Total bilirubin, fluid: 0.7

## 2014-08-12 ENCOUNTER — Telehealth: Payer: Self-pay | Admitting: Family Medicine

## 2014-08-12 NOTE — Telephone Encounter (Signed)
Call patient: I received a copy of his recent lab work performed through [facility]. Blood sugar and uric acid levels look great. Kidney function and liver function are normal. His LDH was just borderline elevated but not considered significant or worrisome. Cholesterol looks fair. Total cholesterol and triglycerides and HDL look great. LDL is mildly elevated at a level of the 126. Normal is under 100. Just make sure working on exercise and low-fat diet and recheck in one year. It is not high enough to start a medication but I do feel like we could try to get it down a little bit. Hemoglobin and platelets were normal. No sign of anemia.

## 2014-08-13 NOTE — Telephone Encounter (Signed)
Pt notified of results

## 2014-08-14 ENCOUNTER — Encounter: Payer: Self-pay | Admitting: Family Medicine

## 2014-12-22 ENCOUNTER — Encounter: Payer: Self-pay | Admitting: Family Medicine

## 2014-12-22 ENCOUNTER — Ambulatory Visit (INDEPENDENT_AMBULATORY_CARE_PROVIDER_SITE_OTHER): Payer: 59 | Admitting: Family Medicine

## 2014-12-22 VITALS — BP 135/84 | HR 83 | Wt 231.0 lb

## 2014-12-22 DIAGNOSIS — M25562 Pain in left knee: Secondary | ICD-10-CM

## 2014-12-22 DIAGNOSIS — G44209 Tension-type headache, unspecified, not intractable: Secondary | ICD-10-CM

## 2014-12-22 DIAGNOSIS — M25561 Pain in right knee: Secondary | ICD-10-CM | POA: Diagnosis not present

## 2014-12-22 DIAGNOSIS — M222X1 Patellofemoral disorders, right knee: Secondary | ICD-10-CM

## 2014-12-22 NOTE — Progress Notes (Signed)
   Subjective:    Patient ID: Terry King, male    DOB: 1976-02-10, 39 y.o.   MRN: [id number]  HPI C/O of bilateral knee pain. See previous note from 01/16/15.  About 5-6 weeks ago went [place] and walked about 12-15 miles per day.  Ok walking but is is the bending and sitting for periods.  Started having more pain when the weather got colder.  He has been wearing a neoprene sleeve on the right and that does help.  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  Had xrays dones done in 2013 that was fairly normal.  Says wearing tennis shoes makes it worse.  Wears SuperFeet inserts but not custom orthotics.  More bothersome going down the stairs.  He reports a history of fallen arches as well.  Has been having frequent tension type headaches. He takes 600mg  IBU and occ Tylenol. Getting 2-3 per week.  Usually goes away with the medication. He tries not ot use it daily bc of rebound headache.  Says if goes to bed with a headache then will wake up with it.  A lot of time HA start  In the back of the head and then radiate to the top. Rarely gets a HA with light sensitivity. No Nausea or vomitingwith it. Says had migraines when he was a teenager. .    Review of Systems     Objective:   Physical Exam  Constitutional: He is oriented to person, place, and time. He appears well-developed and well-nourished.  HENT:  Head: Normocephalic and atraumatic.  Eyes: Conjunctivae and EOM are normal.  Cardiovascular: Normal rate.   Pulmonary/Chest: Effort normal.  Musculoskeletal:  Right knee with normal flexion and extension. He is tender just along the medial side of the patella. Nontender along the medial or lateral joint lines. No swelling today. Negative McMurray's. No increased laxity with anterior posterior drawer. He does have fairly tight hamstrings.  Neurological: He is  alert and oriented to person, place, and time.  Skin: Skin is dry. No pallor.  Psychiatric: He has a normal mood and affect. His behavior is normal.  Vitals reviewed.         Assessment & Plan:  Right knee pain - most consistant with chondromalacia. We discussed diagnosis.  Certainly things like tight quadriceps as well as fallen arches which she both has can increase the risk of developing chondromalacia/patellofemoral syndrome. Given handout on some show just to do on his own. Encouraged him to follow with one of our sports medicine doctors in about 2-3 weeks after doing the exercises if he is not improving. He would also benefit from custom orthotics especially since he does have fallen arches which tends to lead to more pronation of the foot.    Headaches, tension-type-increased frequency recently. We did discuss that we could certainly put him on something prophylactic if he desires. In the interim I do think he is taking safe doses of ibuprofen and certainly can alternate with Tylenol if needed. He is quite aware of the rebound phenomenon they can happen and has been careful with how frequently he is using them. We also discussed working on neck stretches since he gets a lot of tension in the trapezius muscles. In reducing stress levels.

## 2014-12-22 NOTE — Patient Instructions (Signed)
Dr. Isabel CapriceGrapey and Dr. Mena GoesEskridge with [facility].

## 2015-01-12 ENCOUNTER — Ambulatory Visit (INDEPENDENT_AMBULATORY_CARE_PROVIDER_SITE_OTHER): Payer: 59 | Admitting: Sports Medicine

## 2015-01-12 ENCOUNTER — Ambulatory Visit (INDEPENDENT_AMBULATORY_CARE_PROVIDER_SITE_OTHER): Payer: 59

## 2015-01-12 ENCOUNTER — Encounter: Payer: Self-pay | Admitting: Sports Medicine

## 2015-01-12 VITALS — BP 139/91 | HR 101 | Wt 231.0 lb

## 2015-01-12 DIAGNOSIS — M222X1 Patellofemoral disorders, right knee: Secondary | ICD-10-CM

## 2015-01-12 DIAGNOSIS — M222X2 Patellofemoral disorders, left knee: Secondary | ICD-10-CM | POA: Diagnosis not present

## 2015-01-12 DIAGNOSIS — M25561 Pain in right knee: Secondary | ICD-10-CM

## 2015-01-12 DIAGNOSIS — M25562 Pain in left knee: Secondary | ICD-10-CM

## 2015-01-12 MED ORDER — MELOXICAM 15 MG PO TABS: ORAL_TABLET | ORAL | Status: DC

## 2015-01-12 NOTE — Assessment & Plan Note (Signed)
X-rays, meloxicam, formal physical therapy, return to see me for custom orthotics. Discontinue ibuprofen.

## 2015-01-12 NOTE — Progress Notes (Signed)
   Subjective:    I'm seeing this patient as a consultation for:  Knee pain  CC: "my knee hurts"  HPI: Patient presents with intermittent right knee pain since 1990. He says that the pain seems to come on for a few months and then go away for a few months. He is unsure what seems to bring it on or make it go away. The pain is without radiation and is worse when sitting with his knee ~90 degrees flexed. It is relieved with straightening the knee and resting. Exercise does not bring the pain on. He localizes the pain just over the kneecap. He is taking ~600mg  ibuprofen with moderate relief. This most recent episode started Sept 21st of this year. He denies numbness, tingling, weakness, or swelling/discoloration of the area. He does endorse stiffness in the morning that goes away after taking a few steps.  Past medical history, Surgical history, Family history not pertinant except as noted below, Social history, Allergies, and medications have been entered into the medical record, reviewed, and no changes needed.   Review of Systems: No headache, visual changes, nausea, vomiting, diarrhea, constipation, dizziness, abdominal pain, skin rash, fevers, chills, night sweats, weight loss, swollen lymph nodes, body aches, joint swelling, muscle aches, chest pain, shortness of breath, mood changes, visual or auditory hallucinations.   Objective:   General: Well Developed, well nourished, and in no acute distress.  Neuro/Psych: Alert and oriented x3, extra-ocular muscles intact, able to move all 4 extremities, sensation grossly intact. Skin: Warm and dry, no rashes noted.  Respiratory: Not using accessory muscles, speaking in full sentences, trachea midline.  Cardiovascular: Pulses palpable, no extremity edema. Abdomen: Does not appear distended. Right Knee: Normal to inspection with no erythema or effusion or obvious bony abnormalities. Palpation normal with no warmth, joint line tenderness, patellar  tenderness, or condyle tenderness. ROM full in flexion and extension and lower leg rotation. Ligaments with solid consistent endpoints including ACL, PCL, LCL, MCL. Negative Mcmurray's, Apley's, and Thessalonian tests. Non painful patellar compression. Patellar glide without crepitus. Patellar and quadriceps tendons unremarkable. Hamstring and quadriceps strength is normal.   Impression and Recommendations:   This case required medical decision making of moderate complexity.  Patient's symptoms are most likely patellofemoral syndrome given the history of pain that worsens with prolonged knee flexion and his localization of the pain over the patella. Will try conservative therapy with meloxicam and PT. Patient encouraged to return to clinic for orthotics as this should help the patient's knee stability. Will also obtain an X-ray to r/o other pathology and get a baseline.

## 2015-01-20 ENCOUNTER — Encounter: Payer: Self-pay | Admitting: Sports Medicine

## 2015-01-20 ENCOUNTER — Ambulatory Visit (INDEPENDENT_AMBULATORY_CARE_PROVIDER_SITE_OTHER): Payer: 59 | Admitting: Sports Medicine

## 2015-01-20 VITALS — BP 141/78 | HR 90

## 2015-01-20 DIAGNOSIS — M222X1 Patellofemoral disorders, right knee: Secondary | ICD-10-CM

## 2015-01-20 DIAGNOSIS — M222X2 Patellofemoral disorders, left knee: Secondary | ICD-10-CM | POA: Diagnosis not present

## 2015-01-20 NOTE — Progress Notes (Signed)

## 2015-01-20 NOTE — Assessment & Plan Note (Signed)
Custom orthotics as above, return in one month. 

## 2015-02-25 ENCOUNTER — Ambulatory Visit (INDEPENDENT_AMBULATORY_CARE_PROVIDER_SITE_OTHER): Payer: 59 | Admitting: Physical Therapy

## 2015-02-25 DIAGNOSIS — M25661 Stiffness of right knee, not elsewhere classified: Secondary | ICD-10-CM | POA: Diagnosis not present

## 2015-02-25 DIAGNOSIS — M25662 Stiffness of left knee, not elsewhere classified: Secondary | ICD-10-CM

## 2015-02-25 DIAGNOSIS — R29898 Other symptoms and signs involving the musculoskeletal system: Secondary | ICD-10-CM | POA: Diagnosis not present

## 2015-02-25 DIAGNOSIS — M25561 Pain in right knee: Secondary | ICD-10-CM | POA: Diagnosis not present

## 2015-02-25 DIAGNOSIS — M25562 Pain in left knee: Secondary | ICD-10-CM

## 2015-02-25 NOTE — Patient Instructions (Signed)
Anterior Step-Down    Stand with both feet on _3-6__ inch step. Touch heel to the floor and return _10__ times. _1-2__ sets _1__ times per day/every other day.   Straight Leg Raise: With External Leg Rotation    Lie on back with right leg straight, opposite leg bent. Rotate straight leg out and lift to height of knee. Repeat _10_ times per set. Do _2-3___ sets per session. Do _1___ sessions per day.  http://orth.exer.us/728  Hamstring Step 2    Left foot relaxed, knee straight, other leg bent, foot flat. Raise straight leg further upward to maximal range. Hold _30__ seconds. Relax leg completely down. Repeat _2-3__ times.  Stretching: Quadriceps (Standing)    Pull right heel toward buttock until stretch is felt in front of thigh. Hold _30___ seconds, 2-3 timesKNEE: Quadriceps - Prone  --OR--    Place strap around ankle. Bring ankle toward buttocks. Press hip into surface. Hold _30__ seconds. _2-3__ reps per set, _1-2__ sets per day, __5_ days per week   [facility] [address] [place], [place] [address]  [phone] (office) [phone] (fax)

## 2015-02-25 NOTE — Therapy (Signed)
[facility] [place] [address] [place], [place], [address] Phone: [phone]   Fax:  [phone]  Physical Therapy Evaluation  Patient Details  Name: Terry King MRN: [id number] Date of Birth: 10-13-1975 Referring Provider: Dr Benjamin Stain  Encounter Date: 02/25/2015      PT End of Session - 02/25/15 0712    Visit Number 1   Number of Visits 3   Date for PT Re-Evaluation 03/11/15   PT Start Time 0715   PT Stop Time 0800   PT Time Calculation (min) 45 min   Activity Tolerance Patient tolerated treatment well      No past medical history on file.  Past Surgical History  Procedure Laterality Date  . Appendectomy    . Uvulopalatoplasty      For sleep apnea.    . Tonsillectomy      There were no vitals filed for this visit.  Visit Diagnosis:  Arthralgia of both knees - Plan: PT plan of care cert/re-cert  Stiffness of joint, lower leg, left - Plan: PT plan of care cert/re-cert  Stiffness of joint, lower leg, right - Plan: PT plan of care cert/re-cert  Weakness of right leg - Plan: PT plan of care cert/re-cert      Subjective Assessment - 02/25/15 0714    Subjective Pt began having bilat knee pain after walking alot in [place] in September. He did have orthotics made and this has helped him tremendously, before this he was using a knee brace on the Rt.  The Rt knee is worse than [name]  Has relief with patellar self mobs   Pertinent History h/o LCL tear - nonsurgical    How long can you sit comfortably? has stiffness with prolonged sitting   How long can you walk comfortably? no limitations   Diagnostic tests x-rays - no arthritis   Patient Stated Goals have the pain go away in the Rt knee   Currently in Pain? No/denies  " probably will hurt by the time we are done. "            [facility] PT Assessment - 02/25/15 0001    Assessment   Medical Diagnosis bilat patellofemoral syndrome   Referring Provider Dr  Benjamin Stain   Onset Date/Surgical Date 10/26/14   Next MD Visit not scheduled   Prior Therapy in early 2000 for Lt knee.    Precautions   Precautions --  wear shoes as much as possible   Restrictions   Weight Bearing Restrictions --  none   Balance Screen   Has the patient fallen in the past 6 months No   Has the patient had a decrease in activity level because of a fear of falling?  No   Prior Function   Level of Independence Independent   Vocation Full time employment   Vocation Requirements IT and walks alot, stairs and level, pain with descending.    Leisure bowl   Observation/Other Assessments   Focus on Therapeutic Outcomes (FOTO)  29%   Functional Tests   Functional tests Single leg stance;Squat   Squat   Comments wt shift to the Rt, fair eccentric control   Single Leg Stance   Comments WNL   Posture/Postural Control   Posture/Postural Control --  no edema visible   ROM / Strength   AROM / PROM / Strength AROM;Strength   AROM   Overall AROM  Within functional limits for tasks performed   Strength   Overall Strength Comments Rt  ankle WNL, Lt grossly 4+/5   Strength Assessment Site Hip;Knee   Right/Left Hip Right;Left   Right Hip Flexion --  5-/5   Right Hip Extension --  5-/5   Right Hip ABduction 4+/5   Left Hip Flexion --  5-/5   Left Hip Extension 5/5   Left Hip ABduction --  5-/5   Right/Left Knee Right;Left   Right Knee Flexion 5/5   Right Knee Extension 5/5   Left Knee Flexion 5/5   Left Knee Extension 5/5   Flexibility   Soft Tissue Assessment /Muscle Length yes   Hamstrings Rt 38 degrees, Lt 58   Quadriceps Lt 62 degrees in prone, Rt 62   Palpation   Patella mobility bilat WNL , they sit very high    Palpation comment no tenderness around bilat knees                   [facility] Adult PT Treatment/Exercise - 02/25/15 0001    Exercises   Exercises Knee/Hip   Knee/Hip Exercises: Stretches   Passive Hamstring Stretch Right;Left;3  reps;30 seconds   Quad Stretch Right;Left;2 reps;30 seconds   Knee/Hip Exercises: Standing   Step Down 5 reps;Right;Left;Step Height: 4";Step Height: 6";Hand Hold: 2;4 sets  heel taps, 2 sets 5 reps at each step height.   Knee/Hip Exercises: Supine   Straight Leg Raise with External Rotation Right;Left;2 sets;10 reps                PT Education - 02/25/15 0803    Education provided Yes   Education Details HEP   Person(s) Educated Patient   Methods Explanation;Handout;Demonstration;Verbal cues   Comprehension Verbalized understanding;Returned demonstration             PT Long Term Goals - 02/25/15 0742    PT LONG TERM GOAL #1   Title I with advanced HEP ( 03/11/15)    Time 2   Period Weeks   Status New   PT LONG TERM GOAL #2   Title report min to no pain epidodes in a week in his knees ( 03/11/15)    Time 2   Period Weeks   Status New   PT LONG TERM GOAL #3   Title increase strength Rt hip abduction 5-/5 9 03/11/15)    Time 2   Period Weeks   Status New   PT LONG TERM GOAL #4   Title increase flexibilty HS and quads by 10 degrees bilat, quads 72, Rt HS 48, Lt 68   Time 2   Period Weeks   Status New   PT LONG TERM GOAL #5   Title improve FOTO (03/11/15)    Time 2   Period Weeks   Status New               Plan - 02/25/15 16100739    Clinical Impression Statement [age] yo male presents with h/o bilat knee pain.  He started using orthotics a week ago and 80% of his pain is gone.  He does have significant tightness in the hamstrings and quads along with higher level weakness.     Pt will benefit from skilled therapeutic intervention in order to improve on the following deficits Decreased strength;Pain;Decreased range of motion   Rehab Potential Excellent   PT Frequency 2x / week   PT Duration 2 weeks   PT Treatment/Interventions Manual techniques;Therapeutic exercise;Moist Heat;Cryotherapy;Stair training;Patient/family education   PT Next Visit Plan progress  HEP and D/C if still doing well,  if not use one more visit.    Consulted and Agree with Plan of Care Patient         Problem List Patient Active Problem List   Diagnosis Date Noted  . Patellofemoral syndrome of both knees 01/12/2015  . Tension headache 12/22/2014  . SHOULDER PAIN 12/23/2009  . RESTLESS LEGS SYNDROME 09/30/2009  . INSOMNIA 09/30/2009    Roderic Scarce PT 02/25/2015, 8:47 AM  [facility] [address] [place], [place], [address] Phone: [phone]   Fax:  [phone]  Name: Terry King MRN: [id number] Date of Birth: 1976/02/10

## 2015-03-04 ENCOUNTER — Ambulatory Visit (INDEPENDENT_AMBULATORY_CARE_PROVIDER_SITE_OTHER): Payer: 59 | Admitting: Physical Therapy

## 2015-03-04 ENCOUNTER — Encounter: Payer: Self-pay | Admitting: Family Medicine

## 2015-03-04 ENCOUNTER — Ambulatory Visit (INDEPENDENT_AMBULATORY_CARE_PROVIDER_SITE_OTHER): Payer: 59 | Admitting: Family Medicine

## 2015-03-04 VITALS — BP 112/77 | HR 86 | Ht 74.75 in | Wt 228.0 lb

## 2015-03-04 DIAGNOSIS — M25661 Stiffness of right knee, not elsewhere classified: Secondary | ICD-10-CM

## 2015-03-04 DIAGNOSIS — M25561 Pain in right knee: Secondary | ICD-10-CM

## 2015-03-04 DIAGNOSIS — M25562 Pain in left knee: Secondary | ICD-10-CM

## 2015-03-04 DIAGNOSIS — R29898 Other symptoms and signs involving the musculoskeletal system: Secondary | ICD-10-CM

## 2015-03-04 DIAGNOSIS — Z0000 Encounter for general adult medical examination without abnormal findings: Secondary | ICD-10-CM

## 2015-03-04 DIAGNOSIS — Z0189 Encounter for other specified special examinations: Secondary | ICD-10-CM

## 2015-03-04 DIAGNOSIS — M25662 Stiffness of left knee, not elsewhere classified: Secondary | ICD-10-CM

## 2015-03-04 NOTE — Patient Instructions (Signed)
Keep up a regular exercise program and make sure you are eating a healthy diet Try to eat 4 servings of dairy a day, or if you are lactose intolerant take a calcium with vitamin D daily.  Your vaccines are up to date.   

## 2015-03-04 NOTE — Therapy (Addendum)
[facility], [place], [address] Phone: [phone]   Fax:  [phone]  Physical Therapy Treatment  Patient Details  Name: Terry King MRN: [id number] Date of Birth: [birth year] Referring Provider: Dr. Helane Rima  Encounter Date: 03/04/2015      PT End of Session - 03/04/15 0821    Visit Number 2   Number of Visits 3   Date for PT Re-Evaluation 03/11/15   PT Start Time 0738   PT Stop Time 0821   PT Time Calculation (min) 43 min   Activity Tolerance No increased pain;Patient tolerated treatment well      No past medical history on file.  Past Surgical History  Procedure Laterality Date  . Appendectomy    . Uvulopalatoplasty      For sleep apnea.    . Tonsillectomy      There were no vitals filed for this visit.  Visit Diagnosis:  Arthralgia of both knees  Stiffness of joint, lower leg, left  Stiffness of joint, lower leg, right  Weakness of right leg      Subjective Assessment - 03/04/15 0737    Subjective Pt reports he was sore from last session.  Cold weather has aggrivated all joints.  Pt has been up on feet more at work; has noted increased pain if he sits for prolonged periods. Only had one episode of knee pain in past wk due to shoveling; reduced with medication.    Currently in Pain? No/denies            [facility] PT Assessment - 03/04/15 0001    Assessment   Medical Diagnosis bilat patellofemoral syndrome   Referring Provider Dr. Helane Rima   Onset Date/Surgical Date 10/26/14   Next MD Visit PRN   Strength   Strength Assessment Site Hip   Right/Left Hip Right;Left   Right Hip ABduction 5/5   Left Hip ABduction 5/5   Flexibility   Soft Tissue Assessment /Muscle Length yes   Hamstrings Lt 42 deg, Rt 45 deg  Lt 50 deg, Rt 65 after PNF stretches    Quadriceps Lt 3" from buttocks, Rt 5" from buttocks.  ~130 deg knee flexion          [facility] Adult PT  Treatment/Exercise - 03/04/15 0001    Knee/Hip Exercises: Stretches   Active Hamstring Stretch Right;Left;5 reps  PNF contract/relax    Passive Hamstring Stretch Right;Left;2 reps;30 seconds   Quad Stretch Right;Left;2 reps;30 seconds   Piriformis Stretch Right;Left;2 reps;30 seconds   Gastroc Stretch Both;3 reps;30 seconds   Knee/Hip Exercises: Standing   Step Down --  8 reps, 6 inch step - each leg   Step Down Limitations VC and demonstration for improved form    SLS Forward leans to chair height x 10 each leg.    Other Standing Knee Exercises Single leg Stand to sit x 4 each side challenging.       NuStep L5: 5 min for warm up.                  PT Long Term Goals - 03/04/15 4627    PT LONG TERM GOAL #1   Title I with advanced HEP ( 03/11/15)    Time 2   Period Weeks   Status On-going   PT LONG TERM GOAL #2   Title report min to no pain epidodes in a week in his knees ( 03/11/15)    Time 2   Period [place]  Status Achieved   PT LONG TERM GOAL #3   Title increase strength Rt hip abduction 5-/5 9 03/11/15)    Time 2   Period Weeks- Achieved   PT LONG TERM GOAL #4   Title increase flexibilty HS and quads by 10 degrees bilat, quads 72, Rt HS 48, Lt 68   Time 2   Period Weeks   Status Partially Met   PT LONG TERM GOAL #5   Title improve FOTO (03/11/15)    Time 2   Period Weeks   Status On-going               Plan - 03/04/15 2811    Clinical Impression Statement Pt demonstrated improved quad flexibility this visit, some improvement post-stretch in hamstrings; has partially met LTG #4.  Pt has met LTG 2 and 3 and is making good gains towards remaining goals.    Pt will benefit from skilled therapeutic intervention in order to improve on the following deficits Decreased strength;Pain;Decreased range of motion   Rehab Potential Excellent   PT Frequency 2x / week   PT Duration 2 weeks   PT Treatment/Interventions Manual techniques;Therapeutic exercise;Moist  Heat;Cryotherapy;Stair training;Patient/family education   PT Next Visit Plan Spoke to supervising PT;  Pt to return in 2 wks if not doing better; if improved will d/c to HEP.    Consulted and Agree with Plan of Care Patient        Problem List Patient Active Problem List   Diagnosis Date Noted  . Patellofemoral syndrome of both knees 01/12/2015  . Tension headache 12/22/2014  . SHOULDER PAIN 12/23/2009  . RESTLESS LEGS SYNDROME 09/30/2009  . INSOMNIA 09/30/2009    Kerin Perna, PTA 03/04/2015 8:59 AM  [place], [place], [address] Phone: [phone]   Fax:  [phone]  Name: Kawon King MRN: [id number] Date of Birth: 04-30-1975    PHYSICAL THERAPY DISCHARGE SUMMARY  Visits from Start of Care: 2  Current functional level related to goals / functional outcomes: See above  Remaining deficits:see above   Education / Equipment: HEP  Plan: Patient agrees to discharge.  Patient goals were partially met. Patient is being discharged due to not returning since the last visit.  Patient was to perform his HEP and return for treatment if he felt he needed more visits. ?????   Jeral Pinch, PT 03/26/2015 11:54 AM

## 2015-03-04 NOTE — Patient Instructions (Signed)
Balance: Unilateral - Forward Lean    Stand on left foot, hands on hips. Keeping hips level, bend forward as if to touch forehead to wall. Slowly reach for seat of chair.  Repeat __10__ times per set. Do _1-2___ sets per session. Do __3__ sessions per week. Mini Squat: Single Leg    Stand on right foot. Reach forward for balance and do a mini squat. Keep knees in line with second toe. Knees do not go past toes. Keep knees together. Repeat _5-10__ times. Repeat with other leg for set. Rest ___ seconds after set. Do ___ sets per session.  http://plyo.exer.us/72   Copyright  VHI. All rights reserved.    Piriformis Stretch, Sitting    Sit, one ankle on opposite knee, same-side hand on crossed knee. Push down on knee, keeping spine straight. Lean torso forward, with flat back, until tension is felt in hamstrings and gluteals of crossed-leg side. Hold _30__ seconds.  Repeat 2___ times per session. Do _1__ sessions per day.  Calf Stretch    Place hands on wall at shoulder height. Keeping back leg straight, bend front leg, feet pointing forward, heels flat on floor. Lean forward slightly until stretch is felt in calf of back leg. Hold stretch _30__ seconds, breathing slowly in and out. Repeat stretch with other leg back. Do _1-2__ sessions per day. Variation: Use chair or table for support.   [facility] [address] [place], [place] [address]  [phone] (office) [phone] (fax)

## 2015-03-04 NOTE — Progress Notes (Signed)
Subjective:    Patient ID: Terry King, male    DOB: 10-23-1975, 40 y.o.   MRN: [id number]  HPI Here today for complete physical exam. He did have full panel of blood work done in June of last year through work. We have entered those lab results into the system and reviewed those. He is exercising some and has really tried to change his diet to eat more healthy foods. In fact he has lost about 3 pounds since the last time he was here which is fantastic.   Review of Systems  Hypertensive review of systems is negative.  BP 112/77 mmHg  Pulse 86  Ht 6' 2.75" (1.899 m)  Wt 228 lb (103.42 kg)  BMI 28.68 kg/m2  SpO2 97%    No Known Allergies  No past medical history on file.  Past Surgical History  Procedure Laterality Date  . Appendectomy    . Uvulopalatoplasty      For sleep apnea.    . Tonsillectomy      Social History   Social History  . Marital Status: Married    Spouse Name: Amy  . Number of Children: N/A  . Years of Education: N/A   Occupational History  . IT    Social History Main Topics  . Smoking status: Never Smoker   . Smokeless tobacco: Not on file  . Alcohol Use: Yes     Comment: ocassionally.   . Drug Use: No  . Sexual Activity:    Partners: Female   Other Topics Concern  . Not on file   Social History Narrative   No regular exercise.  Does track his steps.     Family History  Problem Relation Age of Onset  . Food Allergy Mother     smoker  . Diabetes Maternal Aunt   . Brain cancer Maternal Grandmother     Outpatient Encounter Prescriptions as of 03/04/2015  Medication Sig  . ibuprofen (ADVIL,MOTRIN) 600 MG tablet Take 600 mg by mouth every 6 (six) hours as needed.  . meloxicam (MOBIC) 15 MG tablet One tab PO qAM with breakfast for 2 weeks, then daily prn pain.   No facility-administered encounter medications on file as of 03/04/2015.           Objective:   Physical Exam  Constitutional: He is oriented to person,  place, and time. He appears well-developed and well-nourished.  HENT:  Head: Normocephalic and atraumatic.  Right Ear: External ear normal.  Left Ear: External ear normal.  Nose: Nose normal.  Mouth/Throat: Oropharynx is clear and moist.  Eyes: Conjunctivae and EOM are normal. Pupils are equal, round, and reactive to light.  Neck: Normal range of motion. Neck supple. No thyromegaly present.  Cardiovascular: Normal rate, regular rhythm, normal heart sounds and intact distal pulses.   Pulmonary/Chest: Effort normal and breath sounds normal.  Abdominal: Soft. Bowel sounds are normal. He exhibits no distension and no mass. There is no tenderness. There is no rebound and no guarding.  Musculoskeletal: Normal range of motion.  Lymphadenopathy:    He has no cervical adenopathy.  Neurological: He is alert and oriented to person, place, and time. He has normal reflexes.  Skin: Skin is warm and dry.  Psychiatric: He has a normal mood and affect. His behavior is normal. Judgment and thought content normal.          Assessment & Plan:  CPE Keep up a regular exercise program and make sure you are eating a healthy  diet Try to eat 4 servings of dairy a day, or if you are lactose intolerant take a calcium with vitamin D daily.  Your vaccines are up to date.  I did go ahead and order blood work today in case he needs to have it done for work though technically he is up-to-date since he just had it done 6 months ago.

## 2015-03-18 ENCOUNTER — Encounter: Payer: Self-pay | Admitting: Physical Therapy

## 2016-03-29 ENCOUNTER — Encounter: Payer: Self-pay | Admitting: Sports Medicine

## 2016-03-29 ENCOUNTER — Ambulatory Visit (INDEPENDENT_AMBULATORY_CARE_PROVIDER_SITE_OTHER): Payer: BLUE CROSS/BLUE SHIELD | Admitting: Sports Medicine

## 2016-03-29 DIAGNOSIS — M722 Plantar fascial fibromatosis: Secondary | ICD-10-CM

## 2016-03-29 MED ORDER — MELOXICAM 15 MG PO TABS: ORAL_TABLET | ORAL | 3 refills | Status: DC

## 2016-03-29 NOTE — Progress Notes (Signed)
   Subjective:    I'm seeing this patient as a consultation for:  Dr. Nani Gasseratherine Metheney  CC: Right heel pain  HPI: For 6 months this pleasant 41 year old male has had worsening pain that he localizes on the plantar aspect of his right heel, worse with first few steps in the morning. Severe, worsening, no radiation. He does have some custom orthotics but feels as though they are worsening his pain.  Past medical history:  Negative.  See flowsheet/record as well for more information.  Surgical history: Negative.  See flowsheet/record as well for more information.  Family history: Negative.  See flowsheet/record as well for more information.  Social history: Negative.  See flowsheet/record as well for more information.  Allergies, and medications have been entered into the medical record, reviewed, and no changes needed.   Review of Systems: No headache, visual changes, nausea, vomiting, diarrhea, constipation, dizziness, abdominal pain, skin rash, fevers, chills, night sweats, weight loss, swollen lymph nodes, body aches, joint swelling, muscle aches, chest pain, shortness of breath, mood changes, visual or auditory hallucinations.   Objective:   General: Well Developed, well nourished, and in no acute distress.  Neuro/Psych: Alert and oriented x3, extra-ocular muscles intact, able to move all 4 extremities, sensation grossly intact. Skin: Warm and dry, no rashes noted.  Respiratory: Not using accessory muscles, speaking in full sentences, trachea midline.  Cardiovascular: Pulses palpable, no extremity edema. Abdomen: Does not appear distended. Right Foot: No visible erythema or swelling. Range of motion is full in all directions. Strength is 5/5 in all directions. No hallux valgus. No pes cavus or pes planus. No abnormal callus noted. No pain over the navicular prominence, or base of fifth metatarsal. Marked tenderness to palpation of the calcaneal insertion of plantar fascia. No  pain at the Achilles insertion. No pain over the calcaneal bursa. No pain of the retrocalcaneal bursa. No tenderness to palpation over the tarsals, metatarsals, or phalanges. No hallux rigidus or limitus. No tenderness palpation over interphalangeal joints. No pain with compression of the metatarsal heads. Neurovascularly intact distally.  Impression and Recommendations:   This case required medical decision making of moderate complexity.  Plantar fasciitis, right Feels as though his orthotics have made the symptoms worse. Meloxicam, gel cups, rehabilitation exercises Return in one month, we will do an injection if no better.

## 2016-03-29 NOTE — Assessment & Plan Note (Signed)
Feels as though his orthotics have made the symptoms worse. Meloxicam, gel cups, rehabilitation exercises Return in one month, we will do an injection if no better.

## 2017-02-27 IMAGING — CR DG KNEE COMPLETE 4+V*R*
6 series · 6 of 6 positions shown · non-contrast
Comparison: Right knee films of 01/16/2012

CLINICAL DATA: Chronic bilateral knee pain for several years, right
greater than left, no injury

EXAM:
RIGHT KNEE - COMPLETE 4+ VIEW

[knee ap (1 of 2)]
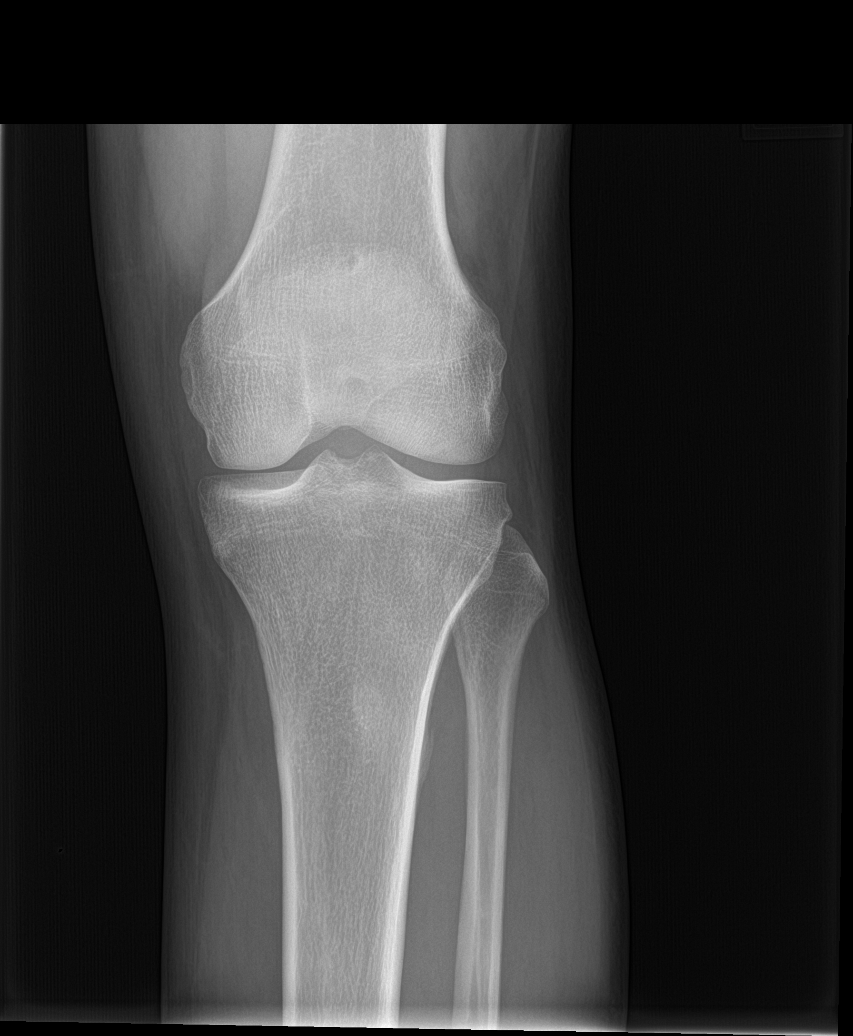

[tunnel]
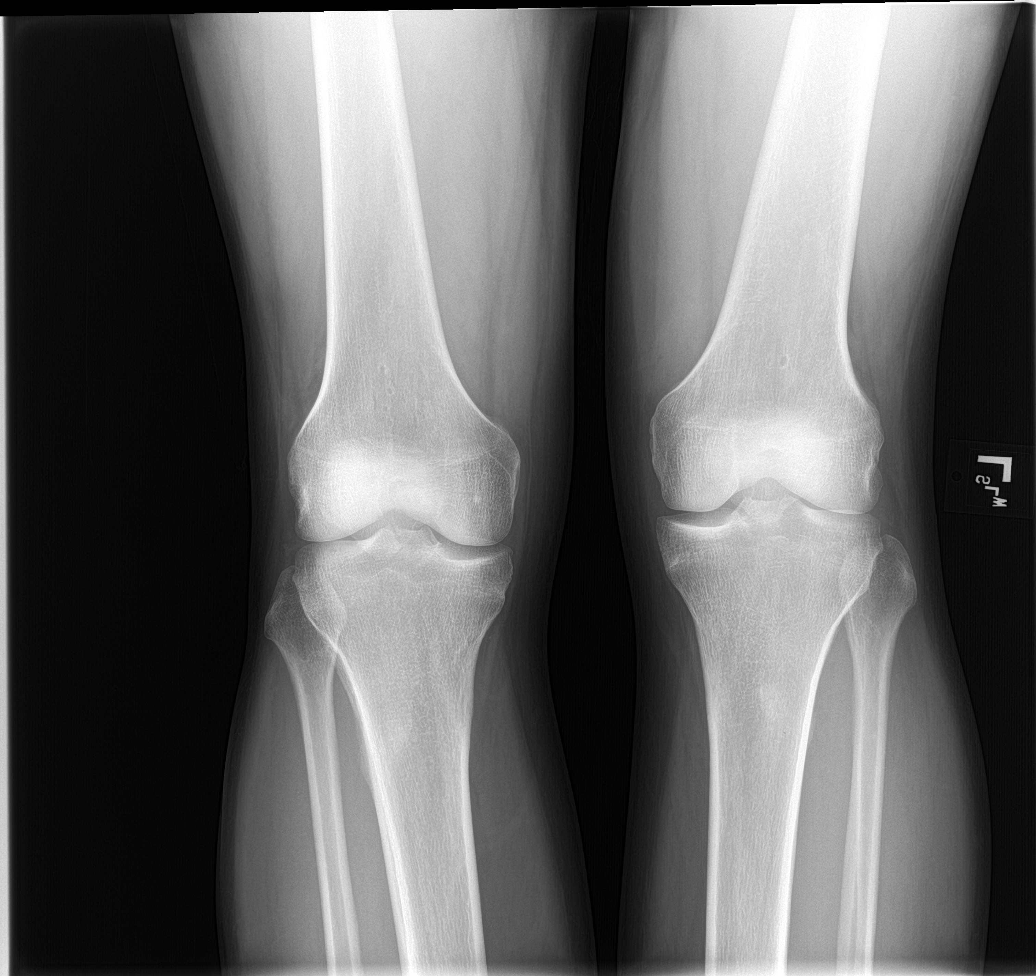

[knee lat (1 of 2)]
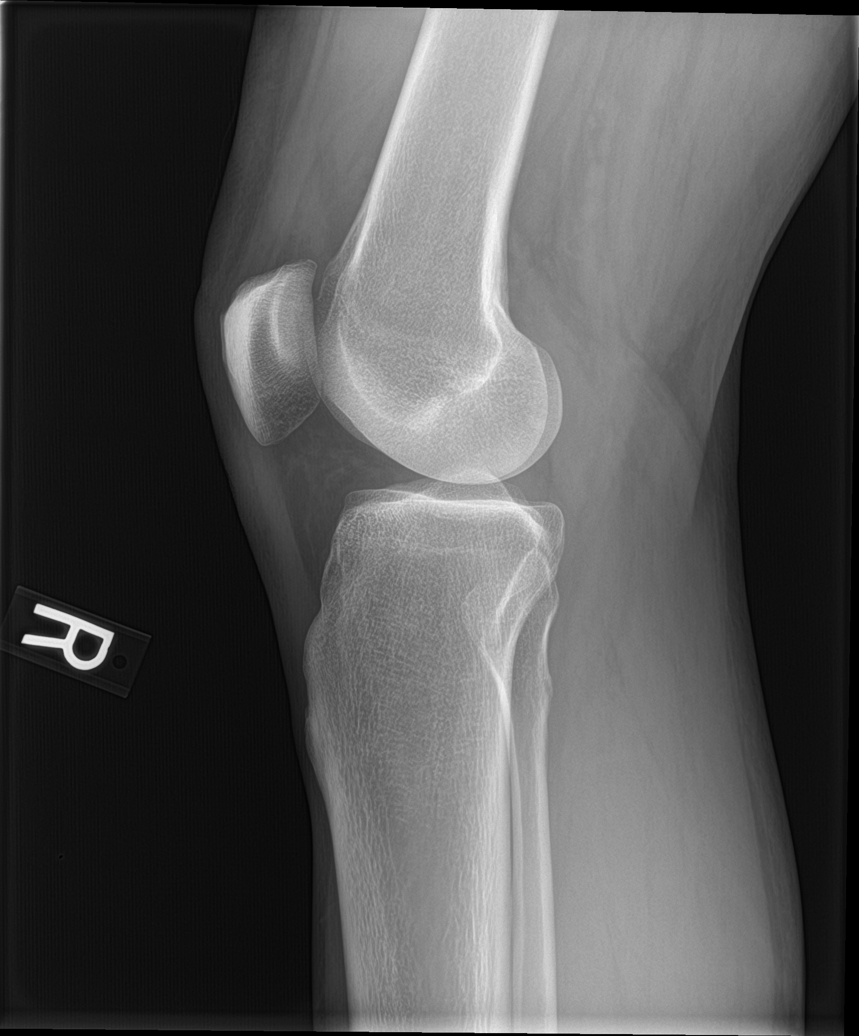

[knee sunrise]
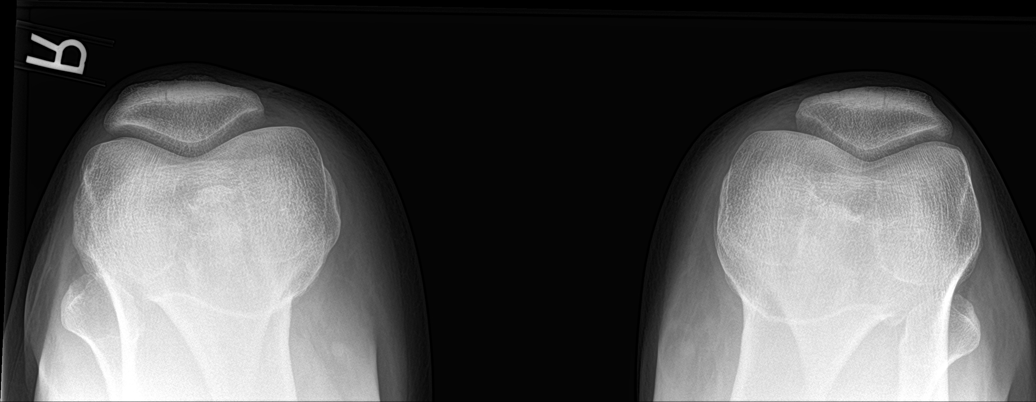

[knee ap (2 of 2)]
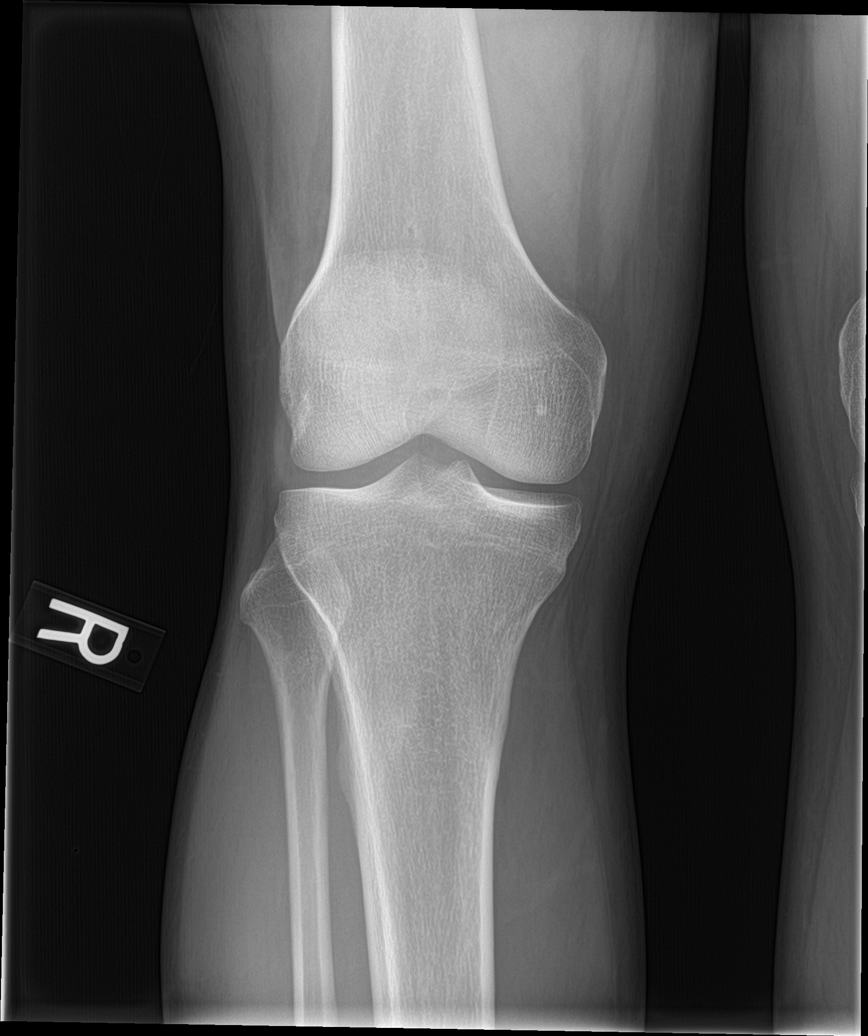

[knee lat (2 of 2)]
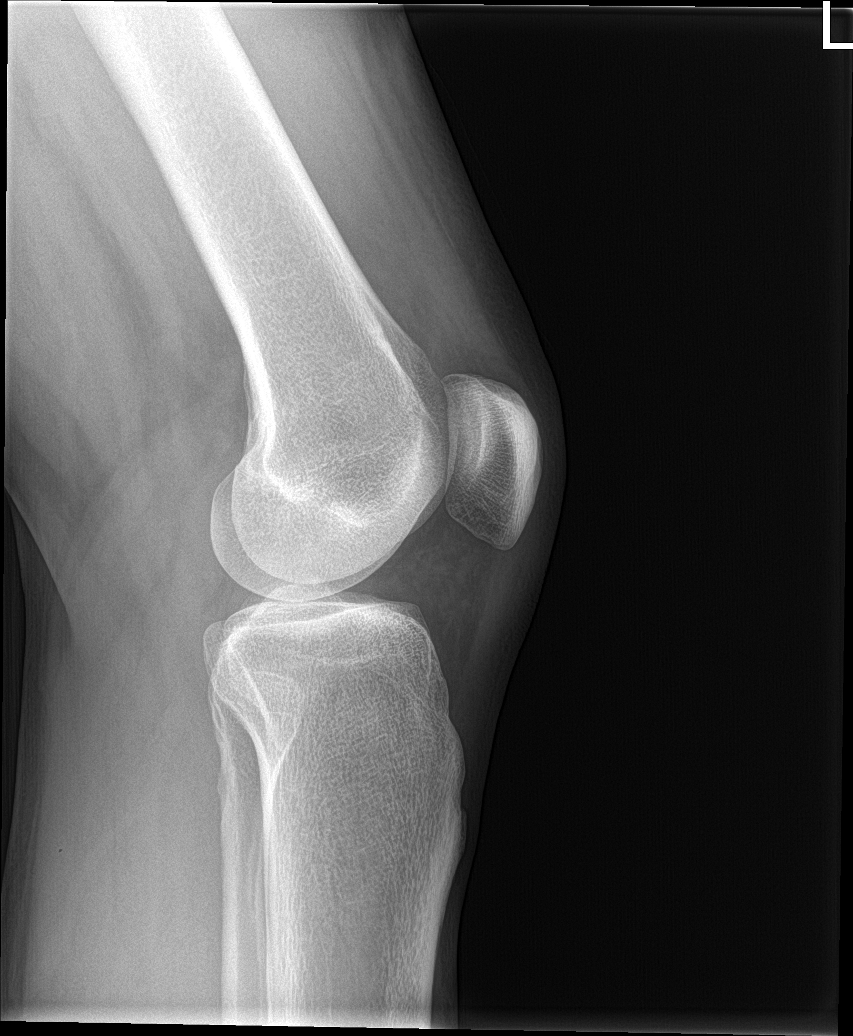

[6 of 6 positions shown; findings below may reference images not displayed]

FINDINGS: The right knee joint spaces are well preserved. No fracture is seen.
No significant degenerative change is noted. The patella is normally
position. No joint effusion is noted.
IMPRESSION: Negative.

## 2017-05-11 ENCOUNTER — Encounter: Payer: Self-pay | Admitting: Family Medicine

## 2017-05-11 ENCOUNTER — Ambulatory Visit (INDEPENDENT_AMBULATORY_CARE_PROVIDER_SITE_OTHER): Payer: BLUE CROSS/BLUE SHIELD | Admitting: Family Medicine

## 2017-05-11 VITALS — BP 109/74 | HR 93 | Temp 98.3°F | Ht 75.0 in | Wt 230.0 lb

## 2017-05-11 DIAGNOSIS — J0100 Acute maxillary sinusitis, unspecified: Secondary | ICD-10-CM

## 2017-05-11 MED ORDER — AMOXICILLIN-POT CLAVULANATE 875-125 MG PO TABS: 1.0000 | ORAL_TABLET | Freq: Two times a day (BID) | ORAL | 0 refills | Status: DC

## 2017-05-11 NOTE — Patient Instructions (Signed)

## 2017-05-11 NOTE — Progress Notes (Signed)
   Subjective:    Patient ID: Terry King, male    DOB: 1975/04/13, 42 y.o.   MRN: [id number]  HPI pt reports that since 04/06/17 he hasn't been feeling well.  He says initially right around that time he feels like he had more flulike symptoms.  He had a fever to 101.8, body aches and upper respiratory symptoms.  After couple weeks he started to feel significantly better and then felt better for a few days before he started getting a sore throat and upper respiratory symptoms again.  At that time he did not have a fever or body aches.  He is been mostly treating with over-the-counter cold medications.  He said it got a little better but now he is getting a lot more sinus pressure and pain.  To the point where he almost went to the local urgent care while he was traveling out of town.  He had copious amounts of yellow mucus coming out of his nose and has had a lot of postnasal drip.  His upper teeth are hurting.  And he is been using ibuprofen this week.  Review of Systems     Objective:   Physical Exam  Constitutional: He is oriented to person, place, and time. He appears well-developed and well-nourished.  HENT:  Head: Normocephalic and atraumatic.  Right Ear: External ear normal.  Left Ear: External ear normal.  Nose: Nose normal.  Mouth/Throat: Oropharynx is clear and moist.  TMs and canals are clear.   Eyes: Pupils are equal, round, and reactive to light. Conjunctivae and EOM are normal.  Neck: Neck supple. No thyromegaly present.  Cardiovascular: Normal rate and normal heart sounds.  Pulmonary/Chest: Effort normal and breath sounds normal.  Lymphadenopathy:    He has no cervical adenopathy.  Neurological: He is alert and oriented to person, place, and time.  Skin: Skin is warm and dry.  Psychiatric: He has a normal mood and affect.        Assessment & Plan:  Acute sinusitis -treat with Augmentin.  Okay to continue over-the-counter symptom medic treatments as well.   Call if not beter in one week.

## 2018-01-02 ENCOUNTER — Telehealth: Payer: Self-pay | Admitting: Family Medicine

## 2018-01-02 DIAGNOSIS — Z114 Encounter for screening for human immunodeficiency virus [HIV]: Secondary | ICD-10-CM

## 2018-01-02 DIAGNOSIS — Z0000 Encounter for general adult medical examination without abnormal findings: Secondary | ICD-10-CM

## 2018-01-02 NOTE — Telephone Encounter (Signed)
Called and informed pt that he can go get these done prior to appt.Marland Kitchen.Marland Kitchen.Heath GoldBarkley, Keelia Graybill Lynetta, CMA

## 2018-01-02 NOTE — Telephone Encounter (Signed)
Pt sent message via mychart. He has scheduled his physical for 11/20 @ 10:40 and would like lab order sent down so that he can have bloodwork done before his appointment.  Thanks.

## 2018-01-08 ENCOUNTER — Telehealth: Payer: Self-pay

## 2018-01-08 ENCOUNTER — Encounter: Payer: Self-pay | Admitting: Family Medicine

## 2018-01-08 ENCOUNTER — Ambulatory Visit (INDEPENDENT_AMBULATORY_CARE_PROVIDER_SITE_OTHER): Payer: BLUE CROSS/BLUE SHIELD | Admitting: Family Medicine

## 2018-01-08 VITALS — BP 139/89 | HR 84 | Ht 74.8 in | Wt 228.0 lb

## 2018-01-08 DIAGNOSIS — N529 Male erectile dysfunction, unspecified: Secondary | ICD-10-CM | POA: Diagnosis not present

## 2018-01-08 DIAGNOSIS — F4321 Adjustment disorder with depressed mood: Secondary | ICD-10-CM

## 2018-01-08 DIAGNOSIS — L84 Corns and callosities: Secondary | ICD-10-CM | POA: Diagnosis not present

## 2018-01-08 DIAGNOSIS — F5102 Adjustment insomnia: Secondary | ICD-10-CM | POA: Diagnosis not present

## 2018-01-08 MED ORDER — ZOLPIDEM TARTRATE 10 MG PO TABS: 10.0000 mg | ORAL_TABLET | Freq: Every evening | ORAL | 1 refills | Status: DC | PRN

## 2018-01-08 NOTE — Telephone Encounter (Signed)
It has been sent!

## 2018-01-08 NOTE — Telephone Encounter (Signed)
Cristal DeerChristopher states he didn't receive a prescription for Ambien.

## 2018-01-08 NOTE — Progress Notes (Signed)
Subjective:    CC:   HPI:  [age] yo here for ED symptoms and foot pain.  He says her about the last 18 months he is been having problems with erectile dysfunction.  He has difficulty maintaining erection and keeping the erection hard enough for penetration.  He says it has been gradually getting worse.  He also reports a decrease in his libido as well as lack of energy and endurance.  Unfortunately recently gone through a separation with his wife.  They are hoping to work through some other issues but he is no longer living in the same home.  Is been very stressful for him.  He said he is actually made his own psychiatry appointment and his appointment is coming up next week.  But in the meantime he is really struggling with sleep.  He has several nights were he is only been getting between 2 to 4 hours of sleep and says it really starting to get to him.  He has been trying Advil PM here there but says it takes couple hours for the grogginess to wear off in the morning even if he gives himself 10 hours.  He had taken Ambien years ago and said did well with that does not remember any negative side effects.  He also has a couple lesions on the bottom of both feet that he thinks are corns.  They are little bumps that he is getting some callus formation around.  He says when he is able to get some of the thick skin off it actually feels better.  Been applying any topical treatments etc.  Past medical history, Surgical history, Family history not pertinant except as noted below, Social history, Allergies, and medications have been entered into the medical record, reviewed, and corrections made.   Review of Systems: No fevers, chills, night sweats, weight loss, chest pain, or shortness of breath.   Objective:    General: Well Developed, well nourished, and in no acute distress.  Neuro: Alert and oriented x3, extra-ocular muscles intact, sensation grossly intact.  HEENT: Normocephalic, atraumatic  Skin:  Warm and dry, no rashes. Cardiac: Regular rate and rhythm, no murmurs rubs or gallops, no lower extremity edema.  Respiratory: Clear to auscultation bilaterally. Not using accessory muscles, speaking in full sentences.   Impression and Recommendations:    Situational depression-going to see behavioral health next week to discuss therapy and may be even prescription medication options.  In the meantime he really wants to focus on his lack of sleep.  Keep appointment with Pearletha FurlKimberly Autrey on November 20.  PHQ 9 score of 13 and gad 7 score of 5.  Rates his depressive symptoms is very difficult.  Insomnia-likely due to situation-we discussed options including trazodone, Ambien etc.  He would like to retry Ambien since he is used to before.  New prescription sent to pharmacy.  Warned about potential side effects and sedation.  Corn/callus-recommend using a pumice stone to remove the excess callus and trying an over-the-counter corn/callus remover which is basically an acid.  Just follow directions on the bottle.  If it does not seem to be improving then we also discussed the possibility of doing cryotherapy in the here in the office.  Erectile dysfunction-ED score of 14 which is significant.  And testosterone questionnaire score of 7 out of 10.  He is coming back in for a physical next week so we can do a testosterone level at that time.

## 2018-01-09 NOTE — Progress Notes (Signed)
.   Androgen Deficiency in the Aging Male    Row Name 01/08/18 1400         Androgen Deficiency in the Aging Male   Do you have a decrease in libido (sex drive)  Yes     Do you have lack of energy  Yes     Do you have a decrease in strength and/or endurance  Yes     Have you lost height  No     Have you noticed a decreased "enjoyment of life"  Yes     Are you sad and/or grumpy  Yes     Are your erections less strong  Yes pt reports he has been under a lot of stress     Have you noticed a recent deterioration in your ability to play sports  Yes     Are you falling asleep after dinner  No     Has there been a recent deterioration in your work performance  No

## 2018-01-09 NOTE — Progress Notes (Signed)
.   SHIM    Row Name 01/08/18 1442         SHIM: Over the last 6 months:   How do you rate your confidence that you could get and keep an erection?  Low     When you had erections with sexual stimulation, how often were your erections hard enough for penetration (entering your partner)?  Sometimes (about half the time)     During sexual intercourse, how often were you able to maintain your erection after you had penetrated (entered) your partner?  Sometimes (about half the time)     During sexual intercourse, how difficult was it to maintain your erection to completion of intercourse?  Difficult     When you attempted sexual intercourse, how often was it satisfactory for you?  Sometimes (about half the time)       SHIM Total Score   SHIM  14

## 2018-01-10 ENCOUNTER — Ambulatory Visit (INDEPENDENT_AMBULATORY_CARE_PROVIDER_SITE_OTHER): Payer: BLUE CROSS/BLUE SHIELD | Admitting: Family Medicine

## 2018-01-10 ENCOUNTER — Encounter: Payer: Self-pay | Admitting: Family Medicine

## 2018-01-10 VITALS — BP 130/88 | HR 86 | Ht 75.2 in | Wt 229.0 lb

## 2018-01-10 DIAGNOSIS — F5102 Adjustment insomnia: Secondary | ICD-10-CM

## 2018-01-10 DIAGNOSIS — Z0000 Encounter for general adult medical examination without abnormal findings: Secondary | ICD-10-CM | POA: Diagnosis not present

## 2018-01-10 LAB — COMPLETE METABOLIC PANEL WITH GFR
AG Ratio: 1.8 (calc) (ref 1.0–2.5)
ALKALINE PHOSPHATASE (APISO): 68 U/L (ref 40–115)
ALT: 27 U/L (ref 9–46)
AST: 17 U/L (ref 10–40)
Albumin: 4 g/dL (ref 3.6–5.1)
BUN: 12 mg/dL (ref 7–25)
CO2: 28 mmol/L (ref 20–32)
CREATININE: 0.97 mg/dL (ref 0.60–1.35)
Calcium: 9 mg/dL (ref 8.6–10.3)
Chloride: 107 mmol/L (ref 98–110)
GFR, Est African American: 110 mL/min/{1.73_m2} (ref >=60)
GFR, Est Non African American: 95 mL/min/{1.73_m2} (ref >=60)
GLUCOSE: 99 mg/dL (ref 65–99)
Globulin: 2.2 g/dL (calc) (ref 1.9–3.7)
Potassium: 4.2 mmol/L (ref 3.5–5.3)
Sodium: 141 mmol/L (ref 135–146)
Total Bilirubin: 0.7 mg/dL (ref 0.2–1.2)
Total Protein: 6.2 g/dL (ref 6.1–8.1)

## 2018-01-10 LAB — LIPID PANEL
CHOL/HDL RATIO: 3.9 (calc) (ref <5.0)
CHOLESTEROL: 154 mg/dL (ref <200)
HDL: 40 mg/dL — ABNORMAL LOW (ref >40)
LDL CHOLESTEROL (CALC): 101 mg/dL — AB
Non-HDL Cholesterol (Calc): 114 mg/dL (calc) (ref <130)
Triglycerides: 50 mg/dL (ref <150)

## 2018-01-10 LAB — CBC
HCT: 46.3 % (ref 38.5–50.0)
Hemoglobin: 16.2 g/dL (ref 13.2–17.1)
MCH: 31.5 pg (ref 27.0–33.0)
MCHC: 35 g/dL (ref 32.0–36.0)
MCV: 90.1 fL (ref 80.0–100.0)
MPV: 11.2 fL (ref 7.5–12.5)
PLATELETS: 227 10*3/uL (ref 140–400)
RBC: 5.14 10*6/uL (ref 4.20–5.80)
RDW: 12.3 % (ref 11.0–15.0)
WBC: 8.1 10*3/uL (ref 3.8–10.8)

## 2018-01-10 NOTE — Assessment & Plan Note (Signed)
Went for labs today will call with results once available.  Vaccines are up-to-date.  He plans on getting his flu shot at [facility] because it is free there.  Urged him to make sure that he is eating regularly.  He admits he is been skipping meals recently.  Also encouraged him to make sure he is exercising regularly.  He feels he was more consistent over the summer but has fallen off some.

## 2018-01-10 NOTE — Patient Instructions (Signed)

## 2018-01-10 NOTE — Assessment & Plan Note (Signed)
He has tried the Ambien for the last 2 nights and says it has been helpful.  He got about 5 hours of the first night in about 5-1/2 to 6 hours last night.  He has not noticed any daytime sedation and has not had any sleepwalking episodes that he is aware of.  As he only took a half a tab both nights.  He says he is can try a whole tab this weekend

## 2018-01-10 NOTE — Progress Notes (Signed)
Established Patient CPE  Office Visit  Subjective:  Patient ID: Terry King, male    DOB: 06-Jun-1975  Age: 42 y.o. MRN: [id number]  CC:  Chief Complaint  Patient presents with  . Annual Exam    pt went this morning to have labs done, he will have flu shot done @ [place] , he reports that he takes 1/2 tab of Ambien and it is working well    HPI Terry King presents for CPE today. wEnt for labs this morning. Followed at the [facility] for his hearing.  He used to drive a tank.  No past medical history on file.  Past Surgical History:  Procedure Laterality Date  . APPENDECTOMY    . TONSILLECTOMY    . UVULOPALATOPLASTY     For sleep apnea.      Family History  Problem Relation Age of Onset  . Food Allergy Mother        smoker  . Diabetes Maternal Aunt   . Brain cancer Maternal Grandmother     Social History   Socioeconomic History  . Marital status: Married    Spouse name: Amy  . Number of children: Not on file  . Years of education: Not on file  . Highest education level: Not on file  Occupational History  . Occupation: Primary school teacher: [employer]  Social Needs  . Financial resource strain: Not on file  . Food insecurity:    Worry: Not on file    Inability: Not on file  . Transportation needs:    Medical: Not on file    Non-medical: Not on file  Tobacco Use  . Smoking status: Never Smoker  . Smokeless tobacco: Never Used  Substance and Sexual Activity  . Alcohol use: Yes    Comment: ocassionally.   . Drug use: No  . Sexual activity: Yes    Partners: Female  Lifestyle  . Physical activity:    Days per week: Not on file    Minutes per session: Not on file  . Stress: Not on file  Relationships  . Social connections:    Talks on phone: Not on file    Gets together: Not on file    Attends religious service: Not on file    Active member of club or organization: Not on file    Attends meetings of clubs or organizations: Not on file   Relationship status: Not on file  . Intimate partner violence:    Fear of current or ex partner: Not on file    Emotionally abused: Not on file    Physically abused: Not on file    Forced sexual activity: Not on file  Other Topics Concern  . Not on file  Social History Narrative   No regular exercise.  Does track his steps.     Outpatient Medications Prior to Visit  Medication Sig Dispense Refill  . ibuprofen (ADVIL,MOTRIN) 600 MG tablet Take 600 mg by mouth every 6 (six) hours as needed.    . zolpidem (AMBIEN) 10 MG tablet Take 1 tablet (10 mg total) by mouth at bedtime as needed for sleep. 15 tablet 1   No facility-administered medications prior to visit.     No Known Allergies  ROS Review of Systems  HENT: Positive for hearing loss.   Eyes: Positive for visual disturbance.  Respiratory: Negative for chest tightness.   Cardiovascular: Negative for chest pain.  Gastrointestinal: Positive for diarrhea. Negative for constipation.  Psychiatric/Behavioral: Positive for  dysphoric mood.      Objective:    Physical Exam  Constitutional: He is oriented to person, place, and time. He appears well-developed and well-nourished.  HENT:  Head: Normocephalic and atraumatic.  Right Ear: External ear normal.  Left Ear: External ear normal.  Nose: Nose normal.  Mouth/Throat: Oropharynx is clear and moist.  Absent uvula   Eyes: Pupils are equal, round, and reactive to light. Conjunctivae and EOM are normal.  Neck: Normal range of motion. Neck supple. No thyromegaly present.  Cardiovascular: Normal rate, regular rhythm, normal heart sounds and intact distal pulses.  Pulmonary/Chest: Effort normal and breath sounds normal.  Abdominal: Soft. Bowel sounds are normal. He exhibits no distension and no mass. There is no tenderness. There is no rebound and no guarding.  Musculoskeletal: Normal range of motion.  Lymphadenopathy:    He has no cervical adenopathy.  Neurological: He is alert  and oriented to person, place, and time. He has normal reflexes.  Skin: Skin is warm and dry.  Psychiatric: He has a normal mood and affect. His behavior is normal. Judgment and thought content normal.    BP 130/88   Pulse 86   Ht 6' 3.2" (1.91 m)   Wt 229 lb (103.9 kg)   SpO2 96%   BMI 28.47 kg/m  Wt Readings from Last 3 Encounters:  01/10/18 229 lb (103.9 kg)  01/08/18 228 lb (103.4 kg)  05/11/17 230 lb (104.3 kg)     Health Maintenance Due  Topic Date Due  . HIV Screening  05/10/1989    There are no preventive care reminders to display for this patient.  Lab Results  Component Value Date   TSH 2.193 09/30/2009   Lab Results  Component Value Date   WBC 6.5 08/01/2014   HGB 16.2 08/01/2014   HCT 46.6 01/16/2012   MCV 91.6 01/16/2012   PLT 230 08/01/2014   Lab Results  Component Value Date   NA 144 08/01/2014   K 4.0 08/01/2014   CO2 26 12/16/2013   GLUCOSE 86 12/16/2013   BUN 11 08/01/2014   CREATININE 1.0 08/01/2014   BILITOT 1.0 12/16/2013   ALKPHOS 74 08/01/2014   AST 24 08/01/2014   ALT 36 08/01/2014   PROT 6.9 12/16/2013   ALBUMIN 4.5 12/16/2013   CALCIUM 9.3 08/01/2014   Lab Results  Component Value Date   CHOL 188 08/01/2014   Lab Results  Component Value Date   HDL 47 08/01/2014   Lab Results  Component Value Date   LDLCALC 126 08/01/2014   Lab Results  Component Value Date   TRIG 75 08/01/2014   Lab Results  Component Value Date   CHOLHDL 3.7 12/16/2013   No results found for: HGBA1C    Assessment & Plan:   Problem List Items Addressed This Visit      Other   Wellness examination - Primary    Went for labs today will call with results once available.  Vaccines are up-to-date.  [facility].  Urged him to make sure that he is eating regularly.  He admits he is been skipping meals recently.  Also encouraged him to make sure he is exercising regularly.  He feels he was more  consistent over the summer but has fallen off some.      INSOMNIA    He has tried the Ambien for the last 2 nights and says it has been helpful.  He got about 5 hours of the first night in about 5-1/2 to 6 hours last night.  He has not noticed any daytime sedation and has not had any sleepwalking episodes that he is aware of.  As he only took a half a tab both nights.  He says he is can try a whole tab this weekend         No orders of the defined types were placed in this encounter.   Follow-up: Return in about 4 months (around 05/11/2018).    Nani Gasseratherine Pauline Trainer, MD

## 2018-01-11 LAB — TESTOSTERONE: TESTOSTERONE: 469 ng/dL (ref 250–827)

## 2018-03-20 ENCOUNTER — Other Ambulatory Visit: Payer: Self-pay | Admitting: Family Medicine

## 2018-06-12 ENCOUNTER — Other Ambulatory Visit: Payer: Self-pay | Admitting: Family Medicine

## 2019-11-21 ENCOUNTER — Other Ambulatory Visit: Payer: Self-pay

## 2019-11-21 ENCOUNTER — Ambulatory Visit (INDEPENDENT_AMBULATORY_CARE_PROVIDER_SITE_OTHER): Payer: BC Managed Care – PPO | Admitting: Family Medicine

## 2019-11-21 ENCOUNTER — Encounter: Payer: Self-pay | Admitting: Family Medicine

## 2019-11-21 VITALS — BP 120/86 | HR 91 | Ht 75.0 in | Wt 246.0 lb

## 2019-11-21 DIAGNOSIS — Z23 Encounter for immunization: Secondary | ICD-10-CM

## 2019-11-21 DIAGNOSIS — Z0000 Encounter for general adult medical examination without abnormal findings: Secondary | ICD-10-CM

## 2019-11-21 NOTE — Progress Notes (Signed)
CPE  Established Patient Office Visit  Subjective:  Patient ID: Terry King, male    DOB: 04-Jun-1975  Age: 44 y.o. MRN: [id number]  CC:  Chief Complaint  Patient presents with  . Annual Exam    HPI Terry King presents for CPE.  Is actually doing really well.  He has been exercising at least 3 days a week for about an hour try to get least 10,000 steps in a day some weeks he will exercises even more.  He was recently promoted to Psychiatric nurse so he is just been really busy and working a lot.  But otherwise does enjoy his job.  He is okay with getting his tetanus updated today but declines the flu vaccine.  History reviewed. No pertinent past medical history.  Past Surgical History:  Procedure Laterality Date  . APPENDECTOMY    . TONSILLECTOMY    . UVULOPALATOPLASTY     For sleep apnea.      Family History  Problem Relation Age of Onset  . Food Allergy Mother        smoker  . Diabetes Maternal Aunt   . Brain cancer Maternal Grandmother     Social History   Socioeconomic History  . Marital status: Married    Spouse name: Amy  . Number of children: Not on file  . Years of education: Not on file  . Highest education level: Not on file  Occupational History  . Occupation: IT    Employer: [employer]  Tobacco Use  . Smoking status: Never Smoker  . Smokeless tobacco: Never Used  Substance and Sexual Activity  . Alcohol use: Yes    Comment: ocassionally.   . Drug use: No  . Sexual activity: Yes    Partners: Female  Other Topics Concern  . Not on file  Social History Narrative   No regular exercise.  Does track his steps.    Social Determinants of Health   Financial Resource Strain:   . Difficulty of Paying Living Expenses: Not on file  Food Insecurity:   . Worried About Programme researcher, broadcasting/film/video in the Last Year: Not on file  . Ran Out of Food in the Last Year: Not on file  Transportation Needs:   . Lack of Transportation (Medical): Not  on file  . Lack of Transportation (Non-Medical): Not on file  Physical Activity:   . Days of Exercise per Week: Not on file  . Minutes of Exercise per Session: Not on file  Stress:   . Feeling of Stress : Not on file  Social Connections:   . Frequency of Communication with Friends and Family: Not on file  . Frequency of Social Gatherings with Friends and Family: Not on file  . Attends Religious Services: Not on file  . Active Member of Clubs or Organizations: Not on file  . Attends Banker Meetings: Not on file  . Marital Status: Not on file  Intimate Partner Violence:   . Fear of Current or Ex-Partner: Not on file  . Emotionally Abused: Not on file  . Physically Abused: Not on file  . Sexually Abused: Not on file    Outpatient Medications Prior to Visit  Medication Sig Dispense Refill  . ibuprofen (ADVIL,MOTRIN) 600 MG tablet Take 600 mg by mouth every 6 (six) hours as needed.    . zolpidem (AMBIEN) 10 MG tablet TAKE 1 TABLET BY MOUTH AT BEDTIME AS NEEDED FOR SLEEP 15 tablet 0   No facility-administered medications  prior to visit.    No Known Allergies  ROS Review of Systems    Objective:    Physical Exam Constitutional:      Appearance: He is well-developed.  HENT:     Head: Normocephalic and atraumatic.     Right Ear: External ear normal.     Left Ear: External ear normal.     Nose: Nose normal.  Eyes:     Conjunctiva/sclera: Conjunctivae normal.     Pupils: Pupils are equal, round, and reactive to light.  Neck:     Thyroid: No thyromegaly.  Cardiovascular:     Rate and Rhythm: Normal rate and regular rhythm.     Heart sounds: Normal heart sounds.  Pulmonary:     Effort: Pulmonary effort is normal.     Breath sounds: Normal breath sounds.  Abdominal:     General: Bowel sounds are normal. There is no distension.     Palpations: Abdomen is soft. There is no mass.     Tenderness: There is no abdominal tenderness. There is no guarding or rebound.   Musculoskeletal:        General: Normal range of motion.     Cervical back: Normal range of motion and neck supple.  Lymphadenopathy:     Cervical: No cervical adenopathy.  Skin:    General: Skin is warm and dry.  Neurological:     Mental Status: He is alert and oriented to person, place, and time.     Deep Tendon Reflexes: Reflexes are normal and symmetric.  Psychiatric:        Behavior: Behavior normal.        Thought Content: Thought content normal.        Judgment: Judgment normal.     BP 120/86   Pulse 91   Ht 6\' 3"  (1.905 m)   Wt 246 lb (111.6 kg)   SpO2 99%   BMI 30.75 kg/m  Wt Readings from Last 3 Encounters:  11/21/19 246 lb (111.6 kg)  01/10/18 229 lb (103.9 kg)  01/08/18 228 lb (103.4 kg)     Health Maintenance Due  Topic Date Due  . Hepatitis C Screening  Never done  . COVID-19 Vaccine (1) Never done    There are no preventive care reminders to display for this patient.  Lab Results  Component Value Date   TSH 2.193 09/30/2009   Lab Results  Component Value Date   WBC 8.1 01/10/2018   HGB 16.2 01/10/2018   HCT 46.3 01/10/2018   MCV 90.1 01/10/2018   PLT 227 01/10/2018   Lab Results  Component Value Date   NA 141 01/10/2018   K 4.2 01/10/2018   CO2 28 01/10/2018   GLUCOSE 99 01/10/2018   BUN 12 01/10/2018   CREATININE 0.97 01/10/2018   BILITOT 0.7 01/10/2018   ALKPHOS 74 08/01/2014   AST 17 01/10/2018   ALT 27 01/10/2018   PROT 6.2 01/10/2018   ALBUMIN 4.5 12/16/2013   CALCIUM 9.0 01/10/2018   Lab Results  Component Value Date   CHOL 154 01/10/2018   Lab Results  Component Value Date   HDL 40 (L) 01/10/2018   Lab Results  Component Value Date   LDLCALC 101 (H) 01/10/2018   Lab Results  Component Value Date   TRIG 50 01/10/2018   Lab Results  Component Value Date   CHOLHDL 3.9 01/10/2018   No results found for: HGBA1C    Assessment & Plan:   Problem List Items Addressed  This Visit      Other   Wellness  examination - Primary   Relevant Orders   CBC   COMPLETE METABOLIC PANEL WITH GFR   Lipid panel   Hemoglobin A1c   Hepatitis C Antibody    Other Visit Diagnoses    Need for tetanus, diphtheria, and acellular pertussis (Tdap) vaccine in patient of adolescent age or older       Relevant Orders   Tdap vaccine greater than or equal to 7yo IM (Completed)     Keep up a regular exercise program and make sure you are eating a healthy diet Try to eat 4 servings of dairy a day, or if you are lactose intolerant take a calcium with vitamin D daily.  Your vaccines are up to date.  Colon cancer screening discussed.  He says he would like to do it but wants to wait until next calendar year just encouraged him to let me know about a month before he wants to get it done so we can get those orders placed. Declines flu vaccine today. Tdap updated.  No orders of the defined types were placed in this encounter.   Follow-up: No follow-ups on file.    Nani Gasser, MD

## 2019-11-21 NOTE — Patient Instructions (Signed)

## 2019-11-22 LAB — COMPLETE METABOLIC PANEL WITH GFR
AG Ratio: 1.8 (calc) (ref 1.0–2.5)
ALT: 30 U/L (ref 9–46)
AST: 17 U/L (ref 10–40)
Albumin: 4.1 g/dL (ref 3.6–5.1)
Alkaline phosphatase (APISO): 82 U/L (ref 36–130)
BUN: 11 mg/dL (ref 7–25)
CO2: 24 mmol/L (ref 20–32)
Calcium: 8.8 mg/dL (ref 8.6–10.3)
Chloride: 106 mmol/L (ref 98–110)
Creat: 1 mg/dL (ref 0.60–1.35)
GFR, Est African American: 105 mL/min/{1.73_m2} (ref >=60)
GFR, Est Non African American: 90 mL/min/{1.73_m2} (ref >=60)
Globulin: 2.3 g/dL (calc) (ref 1.9–3.7)
Glucose, Bld: 111 mg/dL — ABNORMAL HIGH (ref 65–99)
Potassium: 4.2 mmol/L (ref 3.5–5.3)
Sodium: 140 mmol/L (ref 135–146)
Total Bilirubin: 0.8 mg/dL (ref 0.2–1.2)
Total Protein: 6.4 g/dL (ref 6.1–8.1)

## 2019-11-22 LAB — CBC
HCT: 48.1 % (ref 38.5–50.0)
Hemoglobin: 16.7 g/dL (ref 13.2–17.1)
MCH: 31.6 pg (ref 27.0–33.0)
MCHC: 34.7 g/dL (ref 32.0–36.0)
MCV: 91.1 fL (ref 80.0–100.0)
MPV: 11.1 fL (ref 7.5–12.5)
Platelets: 231 10*3/uL (ref 140–400)
RBC: 5.28 10*6/uL (ref 4.20–5.80)
RDW: 12.6 % (ref 11.0–15.0)
WBC: 7.6 10*3/uL (ref 3.8–10.8)

## 2019-11-22 LAB — HEMOGLOBIN A1C
Hgb A1c MFr Bld: 5.5 % of total Hgb (ref <5.7)
Mean Plasma Glucose: 111 (calc)
eAG (mmol/L): 6.2 (calc)

## 2019-11-22 LAB — LIPID PANEL
Cholesterol: 175 mg/dL (ref <200)
HDL: 42 mg/dL (ref >=40)
LDL Cholesterol (Calc): 117 mg/dL (calc) — ABNORMAL HIGH
Non-HDL Cholesterol (Calc): 133 mg/dL (calc) — ABNORMAL HIGH (ref <130)
Total CHOL/HDL Ratio: 4.2 (calc) (ref <5.0)
Triglycerides: 65 mg/dL (ref <150)

## 2019-11-22 LAB — HEPATITIS C ANTIBODY
Hepatitis C Ab: NONREACTIVE
SIGNAL TO CUT-OFF: 0.01 (ref <1.00)

## 2020-01-28 ENCOUNTER — Telehealth: Payer: Self-pay | Admitting: Family Medicine

## 2020-01-28 NOTE — Telephone Encounter (Signed)
Form completed,faxed,confirmation received and scanned into patient's chart. 

## 2020-01-28 NOTE — Telephone Encounter (Signed)
Patient stopped by and dropped off a form to be filled out, form placed in provider box. AM

## 2020-07-31 ENCOUNTER — Encounter: Payer: Self-pay | Admitting: Family Medicine

## 2020-07-31 DIAGNOSIS — Z1211 Encounter for screening for malignant neoplasm of colon: Secondary | ICD-10-CM

## 2020-10-09 ENCOUNTER — Other Ambulatory Visit: Payer: Self-pay

## 2020-10-09 ENCOUNTER — Ambulatory Visit (AMBULATORY_SURGERY_CENTER): Payer: Self-pay | Admitting: *Deleted

## 2020-10-09 VITALS — Ht 75.0 in | Wt 248.0 lb

## 2020-10-09 DIAGNOSIS — Z1211 Encounter for screening for malignant neoplasm of colon: Secondary | ICD-10-CM

## 2020-10-09 MED ORDER — PLENVU 140 G PO SOLR: 1.0000 | Freq: Once | ORAL | 0 refills | Status: AC

## 2020-10-09 NOTE — Progress Notes (Signed)
Patient is here in-person for PV. Patient denies any allergies to eggs or soy. Patient denies any problems with anesthesia/sedation. Patient denies any oxygen use at home. Patient denies taking any diet/weight loss medications or blood thinners. Patient is aware of our care-partner policy and Covid-19 safety protocol.   EMMI education assigned to the patient for the procedure, sent to MyChart.   Patient is COVID-19 vaccinated.  plenvu Prep Prescription coupon was given to the patient.  

## 2020-10-23 ENCOUNTER — Other Ambulatory Visit: Payer: Self-pay

## 2020-10-23 ENCOUNTER — Encounter: Payer: Self-pay | Admitting: Gastroenterology

## 2020-10-23 ENCOUNTER — Ambulatory Visit (AMBULATORY_SURGERY_CENTER): Payer: BC Managed Care – PPO | Admitting: Gastroenterology

## 2020-10-23 VITALS — BP 106/51 | HR 81 | Temp 98.7°F | Resp 9 | Ht 75.0 in | Wt 248.0 lb

## 2020-10-23 DIAGNOSIS — Z1211 Encounter for screening for malignant neoplasm of colon: Secondary | ICD-10-CM

## 2020-10-23 MED ORDER — SODIUM CHLORIDE 0.9 % IV SOLN: 500.0000 mL | Freq: Once | INTRAVENOUS | Status: DC

## 2020-10-23 NOTE — Progress Notes (Signed)
Pt's states no medical or surgical changes since previsit or office visit. 

## 2020-10-23 NOTE — Progress Notes (Signed)
History and Physical:  This patient presents for endoscopic testing for: Encounter Diagnosis  Name Primary?   Special screening for malignant neoplasms, colon Yes   No complaints today    Past Medical History: History reviewed. No pertinent past medical history.   Past Surgical History: Past Surgical History:  Procedure Laterality Date   APPENDECTOMY     TONSILLECTOMY     UVULOPALATOPLASTY     For sleep apnea.      Allergies: No Known Allergies  Outpatient Meds: Current Outpatient Medications  Medication Sig Dispense Refill   Ascorbic Acid (VITAMIN C PO) Take by mouth.     ibuprofen (ADVIL) 200 MG tablet Take 200 mg by mouth every 6 (six) hours as needed.     MULTIPLE VITAMIN PO Take by mouth.     Omega-3 Fatty Acids (FISH OIL PO) Take by mouth.     Current Facility-Administered Medications  Medication Dose Route Frequency Provider Last Rate Last Admin   0.9 %  sodium chloride infusion  500 mL Intravenous Once Sherrilyn Rist, MD          ___________________________________________________________________ Objective   Exam:  BP 125/79   Pulse 83   Temp 98.7 F (37.1 C)   Resp 16   Ht 6\' 3"  (1.905 m)   Wt 248 lb (112.5 kg)   SpO2 99%   BMI 31.00 kg/m   CV: RRR without murmur, S1/S2 Resp: clear to auscultation bilaterally, normal RR and effort noted GI: soft, no tenderness, with active bowel sounds.   Assessment: Encounter Diagnosis  Name Primary?   Special screening for malignant neoplasms, colon Yes     Plan: Colonoscopy  The benefits and risks of the planned procedure were described in detail with the patient or (when appropriate) their health care proxy.  Risks were outlined as including, but not limited to, bleeding, infection, perforation, adverse medication reaction leading to cardiac or pulmonary decompensation, pancreatitis (if ERCP).  The limitation of incomplete mucosal visualization was also discussed.  No guarantees or warranties  were given.    The patient is appropriate for an endoscopic procedure in the ambulatory setting.   - , MD

## 2020-10-23 NOTE — Op Note (Signed)
[facility] Patient Name: Terry King Procedure Date: 10/23/2020 10:48 AM MRN: [id number] Endoscopist: Sherilyn Cooter L. Myrtie Neither , MD Age: 45 Referring MD:  Date of Birth: 1975/10/14 Gender: Male Account #: [id number] Procedure:                Colonoscopy Indications:              Screening for colorectal malignant neoplasm, This                            is the patient's first colonoscopy Medicines:                Monitored Anesthesia Care Procedure:                Pre-Anesthesia Assessment:                           - Prior to the procedure, a History and Physical                            was performed, and patient medications and                            allergies were reviewed. The patient's tolerance of                            previous anesthesia was also reviewed. The risks                            and benefits of the procedure and the sedation                            options and risks were discussed with the patient.                            All questions were answered, and informed consent                            was obtained. Prior Anticoagulants: The patient has                            taken no previous anticoagulant or antiplatelet                            agents. ASA Grade Assessment: II - A patient with                            mild systemic disease. After reviewing the risks                            and benefits, the patient was deemed in                            satisfactory condition to undergo the procedure.  After obtaining informed consent, the colonoscope                            was passed under direct vision. Throughout the                            procedure, the patient's blood pressure, pulse, and                            oxygen saturations were monitored continuously. The                            CF [id number] #[id number] was introduced through the anus                            and advanced to  the the terminal ileum, with                            identification of the appendiceal orifice and IC                            valve. The colonoscopy was performed without                            difficulty. The patient tolerated the procedure                            well. The quality of the bowel preparation was                            good. The terminal ileum, ileocecal valve,                            appendiceal orifice, and rectum were photographed.                            The bowel preparation used was Plenvu. Scope In: 11:04:31 AM Scope Out: 11:15:21 AM Scope Withdrawal Time: 0 hours 9 minutes 0 seconds  Total Procedure Duration: 0 hours 10 minutes 50 seconds  Findings:                 The perianal and digital rectal examinations were                            normal.                           The terminal ileum appeared normal.                           The entire examined colon appeared normal on direct                            and retroflexion views. Complications:            No  immediate complications. Estimated Blood Loss:     Estimated blood loss: none. Impression:               - The examined portion of the ileum was normal.                           - The entire examined colon is normal on direct and                            retroflexion views.                           - No specimens collected. Recommendation:           - Patient has a contact number available for                            emergencies. The signs and symptoms of potential                            delayed complications were discussed with the                            patient. Return to normal activities tomorrow.                            Written discharge instructions were provided to the                            patient.                           - Resume previous diet.                           - Continue present medications.                           - Repeat colonoscopy  in 10 years for screening                            purposes. Lorina Duffner L. Myrtie Neither, MD 10/23/2020 11:21:12 AM This report has been signed electronically.

## 2020-10-23 NOTE — Patient Instructions (Signed)
YOU HAD AN ENDOSCOPIC PROCEDURE TODAY AT THE [facility]:   Refer to the procedure report that was given to you for any specific questions about what was found during the examination.  If the procedure report does not answer your questions, please call your gastroenterologist to clarify.  If you requested that your care partner not be given the details of your procedure findings, then the procedure report has been included in a sealed envelope for you to review at your convenience later.  YOU SHOULD EXPECT: Some feelings of bloating in the abdomen. Passage of more gas than usual.  Walking can help get rid of the air that was put into your GI tract during the procedure and reduce the bloating. If you had a lower endoscopy (such as a colonoscopy or flexible sigmoidoscopy) you may notice spotting of blood in your stool or on the toilet paper. If you underwent a bowel prep for your procedure, you may not have a normal bowel movement for a few days.  Please Note:  You might notice some irritation and congestion in your nose or some drainage.  This is from the oxygen used during your procedure.  There is no need for concern and it should clear up in a day or so.  SYMPTOMS TO REPORT IMMEDIATELY:   Following lower endoscopy (colonoscopy or flexible sigmoidoscopy):  Excessive amounts of blood in the stool  Significant tenderness or worsening of abdominal pains  Swelling of the abdomen that is new, acute  Fever of 100F or higher  For urgent or emergent issues, a gastroenterologist can be reached at any hour by calling [phone]. Do not use MyChart messaging for urgent concerns.    DIET:  We do recommend a small meal at first, but then you may proceed to your regular diet.  Drink plenty of fluids but you should avoid alcoholic beverages for 24 hours.  ACTIVITY:  You should plan to take it easy for the rest of today and you should NOT DRIVE or use heavy machinery until tomorrow (because  of the sedation medicines used during the test).    FOLLOW UP: Our staff will call the number listed on your records 48-72 hours following your procedure to check on you and address any questions or concerns that you may have regarding the information given to you following your procedure. If we do not reach you, we will leave a message.  We will attempt to reach you two times.  During this call, we will ask if you have developed any symptoms of COVID 19. If you develop any symptoms (ie: fever, flu-like symptoms, shortness of breath, cough etc.) before then, please call [phone].  If you test positive for Covid 19 in the 2 weeks post procedure, please call and report this information to us.    If any biopsies were taken you will be contacted by phone or by letter within the next 1-3 weeks.  Please call us at [phone] if you have not heard about the biopsies in 3 weeks.    SIGNATURES/CONFIDENTIALITY: You and/or your care partner have signed paperwork which will be entered into your electronic medical record.  These signatures attest to the fact that that the information above on your After Visit Summary has been reviewed and is understood.  Full responsibility of the confidentiality of this discharge information lies with you and/or your care-partner. 

## 2020-10-23 NOTE — Progress Notes (Signed)
PT taken to PACU. Monitors in place. VSS. Report given to RN. 

## 2020-10-27 ENCOUNTER — Telehealth: Payer: Self-pay

## 2020-10-27 NOTE — Telephone Encounter (Signed)
Attempted f/u call back. No answer left VM. °

## 2020-10-27 NOTE — Telephone Encounter (Signed)
  Follow up Call-  Call back number 10/23/2020  Post procedure Call Back phone  # [phone]  Permission to leave phone message Yes  Some recent data might be hidden     Patient questions:  Do you have a fever, pain , or abdominal swelling? No. Pain Score  0 *  Have you tolerated food without any problems? Yes.    Have you been able to return to your normal activities? Yes.    Do you have any questions about your discharge instructions: Diet   No. Medications  No. Follow up visit  No.  Do you have questions or concerns about your Care? No.  Actions: * If pain score is 4 or above: No action needed, pain <4.  Have you developed a fever since your procedure? no  2.   Have you had an respiratory symptoms (SOB or cough) since your procedure? no  3.   Have you tested positive for COVID 19 since your procedure no  4.   Have you had any family members/close contacts diagnosed with the COVID 19 since your procedure?  no   If yes to any of these questions please route to Laverna Peace, RN and Karlton Lemon, RN

## 2021-06-10 ENCOUNTER — Ambulatory Visit (INDEPENDENT_AMBULATORY_CARE_PROVIDER_SITE_OTHER): Payer: BC Managed Care – PPO

## 2021-06-10 ENCOUNTER — Ambulatory Visit (INDEPENDENT_AMBULATORY_CARE_PROVIDER_SITE_OTHER): Payer: BC Managed Care – PPO | Admitting: Sports Medicine

## 2021-06-10 DIAGNOSIS — M222X1 Patellofemoral disorders, right knee: Secondary | ICD-10-CM

## 2021-06-10 DIAGNOSIS — M222X2 Patellofemoral disorders, left knee: Secondary | ICD-10-CM

## 2021-06-10 DIAGNOSIS — M25561 Pain in right knee: Secondary | ICD-10-CM

## 2021-06-10 DIAGNOSIS — G8929 Other chronic pain: Secondary | ICD-10-CM | POA: Diagnosis not present

## 2021-06-10 MED ORDER — MELOXICAM 15 MG PO TABS: ORAL_TABLET | ORAL | 3 refills | Status: DC

## 2021-06-10 NOTE — Progress Notes (Signed)
? ? ?  Procedures performed today:   ? ?None. ? ?Independent interpretation of notes and tests performed by another provider:  ? ?None. ? ?Brief History, Exam, Impression, and Recommendations:   ? ?Patellofemoral syndrome of both knees ?This is a very pleasant 46 year old male, I last saw him in 2016, we treated him for bilateral patellofemoral syndrome successfully with custom orthotics and NSAIDs. ?He returns today with recurrence of pain, predominantly right-sided, anterior with gelling. ?Exam is for the most part unrevealing with the exception of very tight hamstrings. ?We discussed the pathophysiology, we will restart meloxicam, at home conditioning, and he really needs to work aggressively on hamstring flexibility, he is unable to touch his toes and is approximately 1 foot from his feet with forward bending, he will work on this over the next 6 weeks.   ?If insufficient improvement will consider injection. ? ?Chronic process with exacerbation and pharmacologic intervention ? ?___________________________________________ ?Terry King. Terry King, M.D., ABFM., CAQSM. ?Primary Care and Sports Medicine ?[facility] ? ?Adjunct Instructor of Family Medicine  ?[employer] ?

## 2021-06-10 NOTE — Assessment & Plan Note (Signed)
This is a very pleasant 46 year old male, I last saw him in 2016, we treated him for bilateral patellofemoral syndrome successfully with custom orthotics and NSAIDs. ?He returns today with recurrence of pain, predominantly right-sided, anterior with gelling. ?Exam is for the most part unrevealing with the exception of very tight hamstrings. ?We discussed the pathophysiology, we will restart meloxicam, at home conditioning, and he really needs to work aggressively on hamstring flexibility, he is unable to touch his toes and is approximately 1 foot from his feet with forward bending, he will work on this over the next 6 weeks.   ?If insufficient improvement will consider injection. ?

## 2021-07-08 ENCOUNTER — Encounter: Payer: Self-pay | Admitting: Family Medicine

## 2021-07-08 ENCOUNTER — Ambulatory Visit (INDEPENDENT_AMBULATORY_CARE_PROVIDER_SITE_OTHER): Payer: BC Managed Care – PPO | Admitting: Family Medicine

## 2021-07-08 VITALS — BP 126/86 | HR 85 | Resp 18 | Ht 75.0 in | Wt 244.0 lb

## 2021-07-08 DIAGNOSIS — Z0000 Encounter for general adult medical examination without abnormal findings: Secondary | ICD-10-CM | POA: Diagnosis not present

## 2021-07-08 NOTE — Progress Notes (Signed)
Complete physical exam  Patient: Terry King   DOB: 1975-10-08   46 y.o. Male  MRN: [id number]  Subjective:    Chief Complaint  Patient presents with   Annual Exam    Fasting     Terry King is a 46 y.o. male who presents today for a complete physical exam. He reports consuming a general diet. The patient does not participate in regular exercise at present. He generally feels well. He reports sleeping poorly. He does not have additional problems to discuss today.   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  But unfortunately his pain did not increase.  So he is potentially looking at some other opportunities maybe even at the [place] but he is hoping his job currently we will give him a raise as he really likes his team and his boss.  Most recent fall risk assessment:    07/08/2021    8:46 AM  Fall Risk   Falls in the past year? 0  Number falls in past yr: 0  Injury with Fall? 0  Risk for fall due to : No Fall Risks  Follow up Falls prevention discussed;Falls evaluation completed     Most recent depression screenings:    07/08/2021    8:47 AM 11/21/2019    8:35 AM  PHQ 2/9 Scores  PHQ - 2 Score 0 0    PSA:   Past Surgical History:  Procedure Laterality Date   APPENDECTOMY     TONSILLECTOMY     UVULOPALATOPLASTY     For sleep apnea.     Social History   Tobacco Use   Smoking status: Never   Smokeless tobacco: Never  Vaping Use   Vaping Use: Never used  Substance Use Topics   Alcohol use: Yes    Alcohol/week: 1.0 standard drink    Types: 1 Cans of beer per week    Comment: ocassionally beer/liquor   Drug use: No   Family History  Problem Relation Age of Onset   Food Allergy Mother        smoker   Healthy Mother    Healthy Brother    Brain cancer Maternal Grandmother    Diabetes Maternal Aunt    Healthy Half-Brother    Colon cancer Neg Hx    Colon polyps Neg Hx    Esophageal cancer Neg Hx    Rectal cancer Neg Hx     Stomach cancer Neg Hx       Patient Care Team: Terry Games, MD as PCP - General   Outpatient Medications Prior to Visit  Medication Sig   Ascorbic Acid (VITAMIN C PO) Take by mouth.   meloxicam (MOBIC) 15 MG tablet One tab PO every 24 hours with a meal for 2 weeks, then once every 24 hours prn pain.   MULTIPLE VITAMIN PO Take by mouth.   Omega-3 Fatty Acids (FISH OIL PO) Take by mouth.   No facility-administered medications prior to visit.    ROS        Objective:     BP 126/86   Pulse 85   Resp 18   Ht 6\' 3"  (1.905 m)   Wt 244 lb (110.7 kg)   SpO2 95%   BMI 30.50 kg/m    Physical Exam Constitutional:      Appearance: He is well-developed.  HENT:     Head: Normocephalic and atraumatic.     Right Ear: External ear normal.  Left Ear: External ear normal.     Nose: Nose normal.  Eyes:     Conjunctiva/sclera: Conjunctivae normal.     Pupils: Pupils are equal, round, and reactive to light.  Neck:     Thyroid: No thyromegaly.  Cardiovascular:     Rate and Rhythm: Normal rate and regular rhythm.     Heart sounds: Normal heart sounds.  Pulmonary:     Effort: Pulmonary effort is normal.     Breath sounds: Normal breath sounds.  Abdominal:     General: Bowel sounds are normal. There is no distension.     Palpations: Abdomen is soft. There is no mass.     Tenderness: There is no abdominal tenderness. There is no guarding or rebound.  Musculoskeletal:        General: Normal range of motion.     Cervical back: Normal range of motion and neck supple.  Lymphadenopathy:     Cervical: No cervical adenopathy.  Skin:    General: Skin is warm and dry.  Neurological:     Mental Status: He is alert and oriented to person, place, and time.     Deep Tendon Reflexes: Reflexes are normal and symmetric.  Psychiatric:        Behavior: Behavior normal.        Thought Content: Thought content normal.        Judgment: Judgment normal.     No results found for  any visits on 07/08/21.     Assessment & Plan:    Routine Health Maintenance and Physical Exam  Immunization History  Administered Date(s) Administered   Influenza Split 12/02/2011   Influenza,inj,quad, With Preservative 11/28/2013   Influenza-Unspecified 11/21/2012, 01/12/2015, 12/23/2015   Td 02/22/2008   Tdap 11/21/2019    Health Maintenance  Topic Date Due   COVID-19 Vaccine (1) 07/24/2021 (Originally 11/11/1975)   INFLUENZA VACCINE  09/21/2021   TETANUS/TDAP  11/20/2029   COLONOSCOPY (Pts 45-33yrs Insurance coverage will need to be confirmed)  10/24/2030   Hepatitis C Screening  Completed   HIV Screening  Completed   Pneumococcal Vaccine 71-31 Years old  Aged Out   HPV VACCINES  Aged Out    Discussed health benefits of physical activity, and encouraged him to engage in regular exercise appropriate for his age and condition.  Problem List Items Addressed This Visit   None Visit Diagnoses     Well adult exam    -  Primary   Relevant Orders   Lipid Panel w/reflex Direct LDL   COMPLETE METABOLIC PANEL WITH GFR   CBC   HIV antibody (with reflex)     Keep up a regular exercise program and make sure you are eating a healthy diet Try to eat 4 servings of dairy a day, or if you are lactose intolerant take a calcium with vitamin D daily.  Your vaccines are up to date.   Return in about 1 year (around 07/09/2022) for Wellness Exam.     Terry Gasser, MD

## 2021-07-09 ENCOUNTER — Telehealth: Payer: Self-pay | Admitting: Family Medicine

## 2021-07-09 LAB — COMPLETE METABOLIC PANEL WITH GFR
AG Ratio: 1.8 (calc) (ref 1.0–2.5)
ALT: 25 U/L (ref 9–46)
AST: 17 U/L (ref 10–40)
Albumin: 4.1 g/dL (ref 3.6–5.1)
Alkaline phosphatase (APISO): 81 U/L (ref 36–130)
BUN: 11 mg/dL (ref 7–25)
CO2: 28 mmol/L (ref 20–32)
Calcium: 9 mg/dL (ref 8.6–10.3)
Chloride: 107 mmol/L (ref 98–110)
Creat: 1 mg/dL (ref 0.60–1.29)
Globulin: 2.3 g/dL (calc) (ref 1.9–3.7)
Glucose, Bld: 102 mg/dL — ABNORMAL HIGH (ref 65–99)
Potassium: 4.5 mmol/L (ref 3.5–5.3)
Sodium: 142 mmol/L (ref 135–146)
Total Bilirubin: 0.9 mg/dL (ref 0.2–1.2)
Total Protein: 6.4 g/dL (ref 6.1–8.1)
eGFR: 93 mL/min/{1.73_m2} (ref >=60)

## 2021-07-09 LAB — CBC
HCT: 48.5 % (ref 38.5–50.0)
Hemoglobin: 16.6 g/dL (ref 13.2–17.1)
MCH: 31.9 pg (ref 27.0–33.0)
MCHC: 34.2 g/dL (ref 32.0–36.0)
MCV: 93.3 fL (ref 80.0–100.0)
MPV: 11.4 fL (ref 7.5–12.5)
Platelets: 224 10*3/uL (ref 140–400)
RBC: 5.2 10*6/uL (ref 4.20–5.80)
RDW: 12.4 % (ref 11.0–15.0)
WBC: 6.5 10*3/uL (ref 3.8–10.8)

## 2021-07-09 LAB — LIPID PANEL W/REFLEX DIRECT LDL
Cholesterol: 181 mg/dL (ref <200)
HDL: 44 mg/dL (ref >=40)
LDL Cholesterol (Calc): 122 mg/dL (calc) — ABNORMAL HIGH
Non-HDL Cholesterol (Calc): 137 mg/dL (calc) — ABNORMAL HIGH (ref <130)
Total CHOL/HDL Ratio: 4.1 (calc) (ref <5.0)
Triglycerides: 63 mg/dL (ref <150)

## 2021-07-09 LAB — HIV ANTIBODY (ROUTINE TESTING W REFLEX): HIV 1&2 Ab, 4th Generation: NONREACTIVE

## 2021-07-09 NOTE — Progress Notes (Signed)
Hi Prosper, LDL is still a little elevated its been trending upward over the last 3 years or just really encourage you to continue work on healthy food choices and regular exercise.  Blood count and metabolic panel look good.

## 2021-07-09 NOTE — Telephone Encounter (Signed)
Patient dropped off form that he forgot to bring yesterday during his Physical appt to be filled out. He stated he needs this form faxed to the number at the top of the sheet. Billing form attached and placed in provider box. AMUCK *07/09/21

## 2021-07-12 NOTE — Progress Notes (Signed)
Hi Wayman, negative for HIV.

## 2021-07-13 NOTE — Telephone Encounter (Signed)
Form completed and placed at front desk for pick up.  Pt aware.  Tiajuana Amass, CMA

## 2021-07-13 NOTE — Telephone Encounter (Signed)
Signed and placed in Tonya's box.

## 2022-10-03 ENCOUNTER — Ambulatory Visit (INDEPENDENT_AMBULATORY_CARE_PROVIDER_SITE_OTHER): Payer: BC Managed Care – PPO | Admitting: Family Medicine

## 2022-10-03 ENCOUNTER — Encounter: Payer: Self-pay | Admitting: Family Medicine

## 2022-10-03 VITALS — BP 127/88 | HR 84 | Ht 75.0 in | Wt 252.0 lb

## 2022-10-03 DIAGNOSIS — M545 Low back pain, unspecified: Secondary | ICD-10-CM | POA: Diagnosis not present

## 2022-10-03 MED ORDER — PREDNISONE 20 MG PO TABS: 40.0000 mg | ORAL_TABLET | Freq: Every day | ORAL | 0 refills | Status: DC

## 2022-10-03 MED ORDER — CYCLOBENZAPRINE HCL 10 MG PO TABS: 10.0000 mg | ORAL_TABLET | Freq: Every evening | ORAL | 1 refills | Status: AC | PRN

## 2022-10-03 NOTE — Patient Instructions (Addendum)
See handout for low back.  If you are not better in 2 weeks then please call and we can order formal Physical Therapy.   Can use heat or ice whichever feels better or use [place].

## 2022-10-03 NOTE — Progress Notes (Signed)
   Established Patient Office Visit  Subjective   Patient ID: Efrin Cipriano, male    DOB: 10-02-1975  Age: 47 y.o. MRN: [id number]  Chief Complaint  Patient presents with   Back Pain    HPI  Left low back has been painful for about 2 weeks. Got worse on Friday and had to stop working. Hard to sleep the last couple of nights bc painful to turn over.  Ibu helps some. Avoid tyelnol bc raised his liver enzymes years ago.      ROS    Objective:     BP 127/88   Pulse 84   Ht 6\' 3"  (1.905 m)   Wt 252 lb (114.3 kg)   SpO2 99%   BMI 31.50 kg/m    Physical Exam Musculoskeletal:     Comments: Nontender over the lumbar spine. Non tender over the SI joints.Normal lumbar flexion. Pain with extension.  Normal rotation to the right and left. Pain on the left side with flexion to the right.  Negative straight leg raise, Hip, knee and ankle strength is 5/5.  Patellar reflexes 2+.        No results found for any visits on 10/03/22.    The 10-year ASCVD risk score (Arnett DK, et al., 2019) is: 2.8%    Assessment & Plan:   Problem List Items Addressed This Visit   None Visit Diagnoses     Acute left-sided low back pain without sciatica    -  Primary   Relevant Medications   predniSONE (DELTASONE) 20 MG tablet   cyclobenzaprine (FLEXERIL) 10 MG tablet      Acute sided low back pain with some history of underlying intermittent back pain but is never been this bad before.  Discussed treating with a round of prednisone will need to hold all NSAIDs while on the prednisone after that can restart ibuprofen if he would like.  Use heat and/or ice.  Handout provided with some gentle stretches to do in particular the cat and camel.  Recommend starting a muscle relaxer in the evenings to help him rest and sleep.  If not proving over the next couple weeks then recommend formal physical therapy.  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   Can consider imaging if continuing not to improve.  No follow-ups on file.    Nani Gasser, MD

## 2022-11-15 ENCOUNTER — Encounter: Payer: Self-pay | Admitting: Family Medicine

## 2022-11-15 ENCOUNTER — Ambulatory Visit (INDEPENDENT_AMBULATORY_CARE_PROVIDER_SITE_OTHER): Payer: BC Managed Care – PPO | Admitting: Family Medicine

## 2022-11-15 VITALS — BP 120/72 | HR 88 | Ht 75.0 in | Wt 251.0 lb

## 2022-11-15 DIAGNOSIS — Z23 Encounter for immunization: Secondary | ICD-10-CM | POA: Diagnosis not present

## 2022-11-15 DIAGNOSIS — Z0000 Encounter for general adult medical examination without abnormal findings: Secondary | ICD-10-CM

## 2022-11-15 NOTE — Progress Notes (Signed)
Complete physical exam  Patient: Terry King   DOB: 01-Jan-1976   47 y.o. Male  MRN: [id number]  Subjective:    Chief Complaint  Patient presents with   Annual Exam    [name] is a 47 y.o. male who presents today for a complete physical exam. He reports consuming a general diet. The patient does not participate in regular exercise at present. He generally feels well. He reports sleeping fairly well. He does not have additional problems to discuss today.    Most recent fall risk assessment:    10/03/2022    1:04 PM  Fall Risk   Falls in the past year? 0  Number falls in past yr: 0  Injury with Fall? 0  Risk for fall due to : No Fall Risks  Follow up Falls evaluation completed     Most recent depression screenings:    10/03/2022    1:04 PM 07/08/2021    8:47 AM  PHQ 2/9 Scores  PHQ - 2 Score 0 0        Patient Care Team: Agapito Games, MD as PCP - General   Outpatient Medications Prior to Visit  Medication Sig   Ascorbic Acid (VITAMIN C PO) Take by mouth.   cyclobenzaprine (FLEXERIL) 10 MG tablet Take 1 tablet (10 mg total) by mouth at bedtime as needed for muscle spasms.   MULTIPLE VITAMIN PO Take by mouth.   Omega-3 Fatty Acids (FISH OIL PO) Take by mouth.   [DISCONTINUED] predniSONE (DELTASONE) 20 MG tablet Take 2 tablets (40 mg total) by mouth daily with breakfast.   No facility-administered medications prior to visit.    ROS        Objective:     BP 120/72   Pulse 88   Ht 6\' 3"  (1.905 m)   Wt 251 lb (113.9 kg)   SpO2 97%   BMI 31.37 kg/m    Physical Exam Constitutional:      Appearance: Normal appearance.  HENT:     Head: Normocephalic and atraumatic.     Right Ear: Tympanic membrane, ear canal and external ear normal.     Left Ear: Tympanic membrane, ear canal and external ear normal.     Nose: Nose normal.     Mouth/Throat:     Pharynx: Oropharynx is clear.  Eyes:     Extraocular Movements:  Extraocular movements intact.     Conjunctiva/sclera: Conjunctivae normal.     Pupils: Pupils are equal, round, and reactive to light.  Neck:     Thyroid: No thyromegaly.  Cardiovascular:     Rate and Rhythm: Normal rate and regular rhythm.  Pulmonary:     Effort: Pulmonary effort is normal.     Breath sounds: Normal breath sounds.  Abdominal:     General: Bowel sounds are normal.     Palpations: Abdomen is soft.     Tenderness: There is no abdominal tenderness.  Musculoskeletal:        General: No swelling.     Cervical back: Neck supple.  Skin:    General: Skin is warm and dry.  Neurological:     Mental Status: He is oriented to person, place, and time.  Psychiatric:        Mood and Affect: Mood normal.        Behavior: Behavior normal.      No results found for any visits on 11/15/22.     Assessment & Plan:    Routine Health  Maintenance and Physical Exam  Immunization History  Administered Date(s) Administered   Influenza Split 12/02/2011   Influenza, Seasonal, Injecte, Preservative Fre 11/15/2022   Influenza,inj,quad, With Preservative 11/28/2013   Influenza-Unspecified 11/21/2012, 01/12/2015, 12/23/2015   Td 02/22/2008   Tdap 11/21/2019    Health Maintenance  Topic Date Due   COVID-19 Vaccine (1 - 2023-24 season) 12/01/2023 (Originally 10/23/2022)   DTaP/Tdap/Td (3 - Td or Tdap) 11/20/2029   Colonoscopy  10/24/2030   INFLUENZA VACCINE  Completed   Hepatitis C Screening  Completed   HIV Screening  Completed   Pneumococcal Vaccine 57-62 Years old  Aged Out   HPV VACCINES  Aged Out    Discussed health benefits of physical activity, and encouraged him to engage in regular exercise appropriate for his age and condition.  Problem List Items Addressed This Visit       Other   Wellness examination - Primary   Relevant Orders   CMP14+EGFR   Lipid Panel With LDL/HDL Ratio   CBC   Other Visit Diagnoses     Encounter for immunization       Relevant Orders    Flu vaccine trivalent PF, 6mos and older(Flulaval,Afluria,Fluarix,Fluzone) (Completed)       Keep up a regular exercise program and make sure you are eating a healthy diet Try to eat 4 servings of dairy a day, or if you are lactose intolerant take a calcium with vitamin D daily.  Your vaccines are up to date.   No follow-ups on file.     Nani Gasser, MD

## 2022-11-15 NOTE — Addendum Note (Signed)
Addended by: Deno Etienne on: 11/15/2022 09:45 AM   Modules accepted: Orders

## 2022-11-16 LAB — CMP14+EGFR
ALT: 23 IU/L (ref 0–44)
AST: 16 IU/L (ref 0–40)
Albumin: 4.2 g/dL (ref 4.1–5.1)
Alkaline Phosphatase: 103 IU/L (ref 44–121)
BUN/Creatinine Ratio: 11 (ref 9–20)
BUN: 12 mg/dL (ref 6–24)
Bilirubin Total: 0.6 mg/dL (ref 0.0–1.2)
CO2: 20 mmol/L (ref 20–29)
Calcium: 8.9 mg/dL (ref 8.7–10.2)
Chloride: 105 mmol/L (ref 96–106)
Creatinine, Ser: 1.08 mg/dL (ref 0.76–1.27)
Globulin, Total: 1.9 g/dL (ref 1.5–4.5)
Glucose: 96 mg/dL (ref 70–99)
Potassium: 4.5 mmol/L (ref 3.5–5.2)
Sodium: 141 mmol/L (ref 134–144)
Total Protein: 6.1 g/dL (ref 6.0–8.5)
eGFR: 85 mL/min/{1.73_m2} (ref >59)

## 2022-11-16 LAB — CBC
Hematocrit: 50.8 % (ref 37.5–51.0)
Hemoglobin: 16.7 g/dL (ref 13.0–17.7)
MCH: 30.8 pg (ref 26.6–33.0)
MCHC: 32.9 g/dL (ref 31.5–35.7)
MCV: 94 fL (ref 79–97)
Platelets: 216 10*3/uL (ref 150–450)
RBC: 5.43 x10E6/uL (ref 4.14–5.80)
RDW: 12.3 % (ref 11.6–15.4)
WBC: 7.2 10*3/uL (ref 3.4–10.8)

## 2022-11-16 LAB — LIPID PANEL WITH LDL/HDL RATIO
Cholesterol, Total: 191 mg/dL (ref 100–199)
HDL: 43 mg/dL
LDL Chol Calc (NIH): 133 mg/dL — ABNORMAL HIGH (ref 0–99)
LDL/HDL Ratio: 3.1 ratio (ref 0.0–3.6)
Triglycerides: 82 mg/dL (ref 0–149)
VLDL Cholesterol Cal: 15 mg/dL (ref 5–40)

## 2022-11-16 LAB — HIV ANTIBODY (ROUTINE TESTING W REFLEX): HIV Screen 4th Generation wRfx: NONREACTIVE

## 2022-11-16 NOTE — Progress Notes (Signed)
Hi Terry King, your LDL is up a little at 133, goal under 100. Continue to work on diet and exercise. Metabolic and blood count are normal. Negative for HIV ( we test once in your lifetime)    The 10-year ASCVD risk score (Arnett DK, et al., 2019) is: 2.8%   Values used to calculate the score:     Age: 47 years     Sex: Male     Is Non-Hispanic African American: No     Diabetic: No     Tobacco smoker: No     Systolic Blood Pressure: 120 mmHg     Is BP treated: No     HDL Cholesterol: 43 mg/dL     Total Cholesterol: 191 mg/dL

## 2023-03-01 ENCOUNTER — Telehealth: Payer: BC Managed Care – PPO | Admitting: Family Medicine

## 2023-03-01 DIAGNOSIS — J069 Acute upper respiratory infection, unspecified: Secondary | ICD-10-CM

## 2023-03-01 MED ORDER — BENZONATATE 100 MG PO CAPS: 100.0000 mg | ORAL_CAPSULE | Freq: Three times a day (TID) | ORAL | 0 refills | Status: DC | PRN

## 2023-03-01 MED ORDER — IPRATROPIUM BROMIDE 0.03 % NA SOLN: 2.0000 | Freq: Two times a day (BID) | NASAL | 0 refills | Status: DC

## 2023-03-01 NOTE — Progress Notes (Signed)

## 2023-07-29 ENCOUNTER — Encounter: Payer: Self-pay | Admitting: Family Medicine

## 2023-08-04 MED ORDER — SCOPOLAMINE 1 MG/3DAYS TD PT72: 1.0000 | MEDICATED_PATCH | TRANSDERMAL | 0 refills | Status: DC

## 2023-08-04 NOTE — Addendum Note (Signed)
 Addended by: Jaylon Grode D on: 08/04/2023 10:43 AM   Modules accepted: Orders

## 2023-08-06 ENCOUNTER — Encounter: Payer: Self-pay | Admitting: Family Medicine

## 2023-08-07 NOTE — Telephone Encounter (Signed)
 Please see the MyChart message reply(ies) for my assessment and plan.    This patient gave consent for this Medical Advice Message and is aware that it may result in a bill to [employer], as well as the possibility of receiving a bill for a co-payment or deductible. They are an established patient, but are not seeking medical advice exclusively about a problem treated during an in person or video visit in the last seven days. I did not recommend an in person or video visit within seven days of my reply.    I spent a total of 5 minutes cumulative time within 7 days through [employer].  Nani Gasser, MD

## 2023-08-09 MED ORDER — NYSTATIN 100000 UNIT/GM EX CREA: 1.0000 | TOPICAL_CREAM | Freq: Two times a day (BID) | CUTANEOUS | 0 refills | Status: DC

## 2023-08-09 MED ORDER — AMOXICILLIN-POT CLAVULANATE 875-125 MG PO TABS: 1.0000 | ORAL_TABLET | Freq: Two times a day (BID) | ORAL | 0 refills | Status: DC

## 2023-08-09 NOTE — Telephone Encounter (Signed)
 Meds ordered this encounter  Medications   amoxicillin -clavulanate (AUGMENTIN ) 875-125 MG tablet    Sig: Take 1 tablet by mouth 2 (two) times daily.    Dispense:  14 tablet    Refill:  0   nystatin cream (MYCOSTATIN)    Sig: Apply 1 Application topically 2 (two) times daily. To affected area    Dispense:  15 g    Refill:  0

## 2023-10-24 ENCOUNTER — Encounter: Payer: Self-pay | Admitting: Sports Medicine

## 2023-11-16 ENCOUNTER — Encounter: Payer: Self-pay | Admitting: Family Medicine

## 2023-11-16 ENCOUNTER — Ambulatory Visit (INDEPENDENT_AMBULATORY_CARE_PROVIDER_SITE_OTHER): Admitting: Family Medicine

## 2023-11-16 VITALS — BP 127/71 | HR 85 | Ht 75.0 in | Wt 254.0 lb

## 2023-11-16 DIAGNOSIS — Z23 Encounter for immunization: Secondary | ICD-10-CM | POA: Diagnosis not present

## 2023-11-16 DIAGNOSIS — Z0000 Encounter for general adult medical examination without abnormal findings: Secondary | ICD-10-CM | POA: Diagnosis not present

## 2023-11-16 NOTE — Progress Notes (Signed)
 Complete physical exam  Patient: Terry King   DOB: [birth year]   48 y.o. Male  MRN: [id number]  Subjective:    Chief Complaint  Patient presents with   Annual Exam    Terry King is a 48 y.o. male who presents today for a complete physical exam. He reports consuming a general diet. 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  He generally feels well. He reports sleeping fairly well. He does not have additional problems to discuss today.   Getting over cold with ST, runny nose.  Feels some better today. No fever.   Most recent fall risk assessment:    11/16/2023    9:58 AM  Fall Risk   Falls in the past year? 0  Number falls in past yr: 0  Injury with Fall? 0  Risk for fall due to : No Fall Risks  Follow up Falls evaluation completed     Most recent depression screenings:    11/16/2023    9:58 AM 10/03/2022    1:04 PM  PHQ 2/9 Scores  PHQ - 2 Score 0 0         Patient Care Team: Alvan Dorothyann BIRCH, MD as PCP - General   Outpatient Medications Prior to Visit  Medication Sig   Ascorbic Acid (VITAMIN C PO) Take by mouth.   cyclobenzaprine  (FLEXERIL ) 10 MG tablet Take 1 tablet (10 mg total) by mouth at bedtime as needed for muscle spasms.   MULTIPLE VITAMIN PO Take by mouth.   Omega-3 Fatty Acids (FISH OIL PO) Take by mouth.   [DISCONTINUED] amoxicillin -clavulanate (AUGMENTIN ) 875-125 MG tablet Take 1 tablet by mouth 2 (two) times daily.   [DISCONTINUED] benzonatate  (TESSALON ) 100 MG capsule Take 1 capsule (100 mg total) by mouth 3 (three) times daily as needed for cough.   [DISCONTINUED] ipratropium (ATROVENT ) 0.03 % nasal spray Place 2 sprays into both nostrils every 12 (twelve) hours.   [DISCONTINUED] nystatin  cream (MYCOSTATIN ) Apply 1 Application topically 2 (two) times daily. To affected area   [DISCONTINUED] scopolamine  (TRANSDERM-SCOP) 1 MG/3DAYS Place 1 patch (1.5 mg total) onto the skin every 3 (three) days.   No facility-administered  medications prior to visit.    ROS        Objective:     BP 127/71   Pulse 85   Ht 6' 3 (1.905 m)   Wt 254 lb 0.6 oz (115.2 kg)   SpO2 98%   BMI 31.75 kg/m     Physical Exam Constitutional:      Appearance: Normal appearance.  HENT:     Head: Normocephalic and atraumatic.     Right Ear: Tympanic membrane, ear canal and external ear normal.     Left Ear: Tympanic membrane, ear canal and external ear normal.     Nose: Nose normal.     Mouth/Throat:     Pharynx: Oropharynx is clear.  Eyes:     Extraocular Movements: Extraocular movements intact.     Conjunctiva/sclera: Conjunctivae normal.     Pupils: Pupils are equal, round, and reactive to light.  Neck:     Thyroid: No thyromegaly.  Cardiovascular:     Rate and Rhythm: Normal rate and regular rhythm.  Pulmonary:     Effort: Pulmonary effort is normal.     Breath sounds: Normal breath sounds.  Abdominal:     General: Bowel sounds are normal.     Palpations: Abdomen is soft.     Tenderness: There is no abdominal tenderness.  Musculoskeletal:  General: No swelling.     Cervical back: Neck supple.  Skin:    General: Skin is warm and dry.  Neurological:     Mental Status: He is oriented to person, place, and time.  Psychiatric:        Mood and Affect: Mood normal.        Behavior: Behavior normal.      No results found for any visits on 11/16/23.      Assessment & Plan:    Routine Health Maintenance and Physical Exam  Immunization History  Administered Date(s) Administered   Influenza Split 12/02/2011   Influenza, Seasonal, Injecte, Preservative Fre 11/15/2022, 11/16/2023   Influenza,inj,quad, With Preservative 11/28/2013   Influenza-Unspecified 11/21/2012, 01/12/2015, 12/23/2015   Td 02/22/2008   Tdap 11/21/2019    Health Maintenance  Topic Date Due   Hepatitis B Vaccines 19-59 Average Risk (1 of 3 - 19+ 3-dose series) Never done   COVID-19 Vaccine (1 - 2024-25 season) Never done    DTaP/Tdap/Td (3 - Td or Tdap) 11/20/2029   Colonoscopy  10/24/2030   Influenza Vaccine  Completed   Hepatitis C Screening  Completed   HIV Screening  Completed   Pneumococcal Vaccine  Aged Out   HPV VACCINES  Aged Out   Meningococcal B Vaccine  Aged Out    Discussed health benefits of physical activity, and encouraged him to engage in regular exercise appropriate for his age and condition.  Problem List Items Addressed This Visit       Other   Wellness examination - Primary   Relevant Orders   CBC   Lipid Panel With LDL/HDL Ratio   CMP14+EGFR   HIV antibody (with reflex)   Other Visit Diagnoses       Immunization due       Relevant Orders   Flu vaccine trivalent PF, 6mos and older(Flulaval,Afluria,Fluarix,Fluzone) (Completed)       URI - likely viral. Call if not better into next week. Traveling this weekend.    Return in about 1 year (around 11/15/2024) for Wellness Exam.     Dorothyann Byars, MD

## 2023-11-17 ENCOUNTER — Encounter: Payer: Self-pay | Admitting: Family Medicine

## 2023-11-17 ENCOUNTER — Ambulatory Visit: Payer: Self-pay | Admitting: Family Medicine

## 2023-11-17 LAB — CBC
Hematocrit: 50.5 % (ref 37.5–51.0)
Hemoglobin: 17.1 g/dL (ref 13.0–17.7)
MCH: 31.2 pg (ref 26.6–33.0)
MCHC: 33.9 g/dL (ref 31.5–35.7)
MCV: 92 fL (ref 79–97)
Platelets: 216 x10E3/uL (ref 150–450)
RBC: 5.48 x10E6/uL (ref 4.14–5.80)
RDW: 13 % (ref 11.6–15.4)
WBC: 7.3 x10E3/uL (ref 3.4–10.8)

## 2023-11-17 LAB — LIPID PANEL WITH LDL/HDL RATIO
Cholesterol, Total: 181 mg/dL (ref 100–199)
HDL: 38 mg/dL — ABNORMAL LOW (ref >39)
LDL Chol Calc (NIH): 123 mg/dL — ABNORMAL HIGH (ref 0–99)
LDL/HDL Ratio: 3.2 ratio (ref 0.0–3.6)
Triglycerides: 110 mg/dL (ref 0–149)
VLDL Cholesterol Cal: 20 mg/dL (ref 5–40)

## 2023-11-17 LAB — HIV ANTIBODY (ROUTINE TESTING W REFLEX): HIV Screen 4th Generation wRfx: NONREACTIVE

## 2023-11-17 LAB — CMP14+EGFR
ALT: 34 IU/L (ref 0–44)
AST: 24 IU/L (ref 0–40)
Albumin: 4.3 g/dL (ref 4.1–5.1)
Alkaline Phosphatase: 91 IU/L (ref 47–123)
BUN/Creatinine Ratio: 10 (ref 9–20)
BUN: 11 mg/dL (ref 6–24)
Bilirubin Total: 0.6 mg/dL (ref 0.0–1.2)
CO2: 21 mmol/L (ref 20–29)
Calcium: 9.2 mg/dL (ref 8.7–10.2)
Chloride: 103 mmol/L (ref 96–106)
Creatinine, Ser: 1.09 mg/dL (ref 0.76–1.27)
Globulin, Total: 2.3 g/dL (ref 1.5–4.5)
Glucose: 108 mg/dL — ABNORMAL HIGH (ref 70–99)
Potassium: 4.6 mmol/L (ref 3.5–5.2)
Sodium: 139 mmol/L (ref 134–144)
Total Protein: 6.6 g/dL (ref 6.0–8.5)
eGFR: 83 mL/min/1.73 (ref >59)

## 2023-11-17 NOTE — Progress Notes (Signed)
 Hi Merle, LDL cholesterol looks a little better compared to last year.  Still elevated but going in the right direction.  Metabolic panel overall looks good.  Blood counts normal no sign of anemia.  Negative for HIV.

## 2024-02-08 ENCOUNTER — Ambulatory Visit: Admitting: Family Medicine

## 2024-02-08 VITALS — BP 134/86 | HR 81

## 2024-02-08 DIAGNOSIS — R3912 Poor urinary stream: Secondary | ICD-10-CM

## 2024-02-08 DIAGNOSIS — Z23 Encounter for immunization: Secondary | ICD-10-CM

## 2024-02-08 DIAGNOSIS — M722 Plantar fascial fibromatosis: Secondary | ICD-10-CM

## 2024-02-08 DIAGNOSIS — R3913 Splitting of urinary stream: Secondary | ICD-10-CM

## 2024-02-08 DIAGNOSIS — L731 Pseudofolliculitis barbae: Secondary | ICD-10-CM | POA: Diagnosis not present

## 2024-02-08 DIAGNOSIS — R300 Dysuria: Secondary | ICD-10-CM

## 2024-02-08 NOTE — Progress Notes (Signed)
 Acute Office Visit  Patient ID: Terry King, male    DOB: 02-03-1976, 48 y.o.   MRN: [id number]  PCP: Alvan Dorothyann BIRCH, MD  Chief Complaint  Patient presents with   Dysuria    Subjective:     HPI  Discussed the use of AI scribe software for clinical note transcription with the patient, who gave verbal consent to proceed.  History of Present Illness Rohith Fauth is a 48 year old male who presents with urinary symptoms and heel pain.  Lower urinary tract symptoms - Several months of urinary symptoms - Initial change in urine stream, becoming less solid and more like a spray - Occasional hematuria - Stinging sensation at the tip of the urethra during urination, now worsening and occurring almost every time - Sensation of blockage and decreased urine flow volume - Urine stream sometimes sprays upwards instead of downwards  Localized cutaneous lesion (recurrent ingrown hair) - Recurrent ingrown hairs in the same spot on the left side, approximately twice a year - Recently removed the hair and expressed the contents, but a small lump remains - Area described as a 'dysfunctional pore' with hair growing in a curl, causing blockage and inflammation  Left heel pain - Left heel pain after walking, especially when standing up after sitting - Walks 6,000 to 7,000 steps a day on weekends, with more pronounced pain during these times - Pain located at the back of the heel, described as immense when standing after sitting - Pain subsides somewhat during the week with less activity - Morning pain for a few steps after getting out of bed, sometimes requiring support for ambulation - Uses supportive shoes with inserts and cushioned sandals at home, avoids going barefoot - Pulling sensation on the inseam when stretching   Lab Results  Component Value Date   HGBA1C 5.5 11/21/2019     RO-AUA SYMPTOM     Row Name 02/08/24 1600         During the last Month    Sensation of Bladder not Empty Not at all     Urinate<2 hours after last Less than 1 time in 5     Mult. stop/start when voiding Not at all     Difficult to postpone voiding Less than 1 time in 5     Weak urinary stream Less than 1 time in 5     Push/strain to begin urination Not at all     Times per night up to urinate Less than 1 time in 5       OTHER   Total Score 4         ROS     Objective:    BP 134/86   Pulse 81   SpO2 97%    Physical Exam Vitals reviewed.  Constitutional:      Appearance: Normal appearance.  HENT:     Head: Normocephalic.  Pulmonary:     Effort: Pulmonary effort is normal.  Genitourinary:    Comments: In the suprapubic area on the left there is a small epidermal cyst Musculoskeletal:     Comments: Tender over base of left heel towards arch, non tender over the achilles. Some discomfort with dorsiflex.   Skin:    Comments: No rash or erythema at the tip of the penis.   Neurological:     Mental Status: He is alert and oriented to person, place, and time.  Psychiatric:        Mood and Affect: Mood normal.  Behavior: Behavior normal.       No results found for any visits on 02/08/24.     Assessment & Plan:   Problem List Items Addressed This Visit   None Visit Diagnoses       Dysuria    -  Primary   Relevant Orders   Ct, Ng, Mycoplasmas NAA, Urine   Urinalysis, Routine w reflex microscopic     Weak urinary stream       Relevant Orders   Ambulatory referral to Urology     Split urinary stream       Relevant Orders   Ambulatory referral to Urology     Plantar fasciitis, left         Ingrown hair         Need for hepatitis A and B vaccination       Relevant Orders   Hepatitis A hepatitis B combined vaccine IM (Completed)       Assessment and Plan Assessment & Plan ? Urethral stricture and evaluation for benign prostatic hyperplasia Intermittent urinary stream issues suggest urethral stricture or BPH. Possible  urethral narrowing due to prior infection or trauma. Differential includes bladder outlet obstruction from BPH. No current infection signs, further evaluation needed. - Ordered urine analysis and STD screening nd urine micro (since occ passes clots)  - Administered AUA prostate questionnaire. Score of 4, rate QOL as mixed - Referred to urologist for further evaluation and possible scope.  Recurrent epidermal cyst with ingrown hair Recurrent ingrown hair likely due to dysfunctional pore or follicle. Recent episode resolved, residual lump remains. No current infection signs. Discussed potential excision by dermatologist, but he prefers to wait. - Monitor for signs of infection. - Discuss potential referral to dermatologist for excision if desired in the future.  Plantar fasciitis, left foot Left heel pain and morning stiffness consistent with plantar fasciitis. Discussed conservative management options. - Provided stretching exercises for plantar fascia. - Advised wearing supportive shoes and avoiding going barefoot. - Consider referral to sports medicine if no improvement.    No orders of the defined types were placed in this encounter.   Return if symptoms worsen or fail to improve.  Dorothyann Byars, MD [facility] at [facility]

## 2024-02-11 LAB — URINALYSIS, ROUTINE W REFLEX MICROSCOPIC
Bilirubin, UA: NEGATIVE
Glucose, UA: NEGATIVE
Ketones, UA: NEGATIVE
Nitrite, UA: NEGATIVE
Protein,UA: NEGATIVE
RBC, UA: NEGATIVE
Specific Gravity, UA: 1.018 (ref 1.005–1.030)
Urobilinogen, Ur: 1 mg/dL (ref 0.2–1.0)
pH, UA: 6.5 (ref 5.0–7.5)

## 2024-02-11 LAB — CT, NG, MYCOPLASMAS NAA, URINE
Mycoplasma hominis NAA: POSITIVE — AB
Ureaplasma spp NAA: POSITIVE — AB

## 2024-02-11 LAB — MICROSCOPIC EXAMINATION
Bacteria, UA: NONE SEEN
Casts: NONE SEEN /LPF
RBC, Urine: NONE SEEN /HPF (ref 0–2)
WBC, UA: NONE SEEN /HPF (ref 0–5)

## 2024-02-12 ENCOUNTER — Ambulatory Visit: Payer: Self-pay | Admitting: Family Medicine

## 2024-02-12 MED ORDER — DOXYCYCLINE HYCLATE 100 MG PO TABS: 100.0000 mg | ORAL_TABLET | Freq: Two times a day (BID) | ORAL | 0 refills | Status: AC

## 2024-02-12 NOTE — Progress Notes (Signed)
 Hi Terry King wanted to let you know that the urine sample came back positive for mycoplasma some going to send over an antibiotic called doxycycline  to clear that up it should resolve your symptoms but if it does not then please let me know
